# Patient Record
Sex: Female | Born: 1965 | Race: White | Hispanic: No | Smoking: Current every day smoker
Health system: Southern US, Community
[De-identification: ages and names within clinical notes are randomized; demographics above are authoritative.]

## PROBLEM LIST (undated history)

## (undated) DIAGNOSIS — F329 Major depressive disorder, single episode, unspecified: Secondary | ICD-10-CM

## (undated) DIAGNOSIS — E876 Hypokalemia: Secondary | ICD-10-CM

## (undated) DIAGNOSIS — M1990 Unspecified osteoarthritis, unspecified site: Secondary | ICD-10-CM

## (undated) DIAGNOSIS — F1290 Cannabis use, unspecified, uncomplicated: Secondary | ICD-10-CM

## (undated) DIAGNOSIS — N6019 Diffuse cystic mastopathy of unspecified breast: Secondary | ICD-10-CM

## (undated) DIAGNOSIS — F32A Depression, unspecified: Secondary | ICD-10-CM

## (undated) DIAGNOSIS — E039 Hypothyroidism, unspecified: Secondary | ICD-10-CM

## (undated) HISTORY — DX: Depression, unspecified: F32.A

## (undated) HISTORY — DX: Hypothyroidism, unspecified: E03.9

## (undated) HISTORY — DX: Major depressive disorder, single episode, unspecified: F32.9

## (undated) HISTORY — DX: Hypokalemia: E87.6

## (undated) HISTORY — DX: Cannabis use, unspecified, uncomplicated: F12.90

---

## 2005-02-04 ENCOUNTER — Ambulatory Visit: Payer: Self-pay | Admitting: Family Medicine

## 2005-02-24 ENCOUNTER — Ambulatory Visit: Payer: Self-pay | Admitting: Family Medicine

## 2006-03-05 ENCOUNTER — Ambulatory Visit: Payer: Self-pay | Admitting: Certified Nurse Midwife

## 2007-03-18 ENCOUNTER — Ambulatory Visit: Payer: Self-pay | Admitting: Certified Nurse Midwife

## 2008-03-02 ENCOUNTER — Ambulatory Visit: Payer: Self-pay | Admitting: Family Medicine

## 2009-04-03 ENCOUNTER — Ambulatory Visit: Payer: Self-pay | Admitting: Family Medicine

## 2010-12-30 ENCOUNTER — Ambulatory Visit: Payer: Self-pay

## 2013-04-13 ENCOUNTER — Emergency Department: Payer: Self-pay | Admitting: Emergency Medicine

## 2013-04-13 LAB — CBC
HCT: 40.3 % (ref 35.0–47.0)
HGB: 13.6 g/dL (ref 12.0–16.0)
MCHC: 33.8 g/dL (ref 32.0–36.0)
MCV: 96 fL (ref 80–100)
RBC: 4.21 10*6/uL (ref 3.80–5.20)
RDW: 13.2 % (ref 11.5–14.5)
WBC: 3 10*3/uL — ABNORMAL LOW (ref 3.6–11.0)

## 2013-04-13 LAB — URINALYSIS, COMPLETE
Bilirubin,UR: NEGATIVE
Glucose,UR: NEGATIVE mg/dL (ref 0–75)
Ketone: NEGATIVE
Nitrite: NEGATIVE
Ph: 7 (ref 4.5–8.0)
Protein: NEGATIVE

## 2013-04-13 LAB — BASIC METABOLIC PANEL
Anion Gap: 7 (ref 7–16)
Creatinine: 0.68 mg/dL (ref 0.60–1.30)
Osmolality: 267 (ref 275–301)
Potassium: 3.5 mmol/L (ref 3.5–5.1)
Sodium: 134 mmol/L — ABNORMAL LOW (ref 136–145)

## 2016-03-10 ENCOUNTER — Encounter: Payer: Self-pay | Admitting: *Deleted

## 2016-03-10 NOTE — Progress Notes (Unsigned)
Pulmonary Individual Treatment Plan  Patient Details  Name: Kara Russo MRN: [id number] Date of Birth: 08/13/1966 Referring Provider:  No ref. provider found  Initial Encounter Date:   Visit Diagnosis: No diagnosis found.  Patient's Home Medications on Admission: No current outpatient prescriptions on file.  Past Medical History: No past medical history on file.  Tobacco Use: History  Smoking status  . Not on file  Smokeless tobacco  . Not on file    Labs: Recent Review Flowsheet Data    There is no flowsheet data to display.       ADL UCSD:   Pulmonary Function Assessment:   Exercise Target Goals:    Exercise Program Goal: Individual exercise prescription set with THRR, safety & activity barriers. Participant demonstrates ability to understand and report RPE using BORG scale, to self-measure pulse accurately, and to acknowledge the importance of the exercise prescription.  Exercise Prescription Goal: Starting with aerobic activity 30 plus minutes a day, 3 days per week for initial exercise prescription. Provide home exercise prescription and guidelines that participant acknowledges understanding prior to discharge.  Activity Barriers & Risk Stratification:   6 Minute Walk:   Initial Exercise Prescription:   Perform Capillary Blood Glucose checks as needed.  Exercise Prescription Changes:   Exercise Comments:   Discharge Exercise Prescription (Final Exercise Prescription Changes):    Nutrition:  Target Goals: Understanding of nutrition guidelines, daily intake of sodium 1500mg , cholesterol 200mg , calories 30% from fat and 7% or less from saturated fats, daily to have 5 or more servings of fruits and vegetables.  Biometrics:    Nutrition Therapy Plan and Nutrition Goals:   Nutrition Discharge: Rate Your Plate Scores:   Psychosocial: Target Goals: Acknowledge presence or absence of depression, maximize coping skills, provide  positive support system. Participant is able to verbalize types and ability to use techniques and skills needed for reducing stress and depression.  Initial Review & Psychosocial Screening:   Quality of Life Scores:   PHQ-9: Recent Review Flowsheet Data    There is no flowsheet data to display.      Psychosocial Evaluation and Intervention:   Psychosocial Re-Evaluation:  Education: Education Goals: Education classes will be provided on a weekly basis, covering required topics. Participant will state understanding/return demonstration of topics presented.  Learning Barriers/Preferences:   Education Topics: Initial Evaluation Education: - Verbal, written and demonstration of respiratory meds, RPE/PD scales, oximetry and breathing techniques. Instruction on use of nebulizers and MDIs: cleaning and proper use, rinsing mouth with steroid doses and importance of monitoring MDI activations.   General Nutrition Guidelines/Fats and Fiber: -Group instruction provided by verbal, written material, models and posters to present the general guidelines for heart healthy nutrition. Gives an explanation and review of dietary fats and fiber.   Controlling Sodium/Reading Food Labels: -Group verbal and written material supporting the discussion of sodium use in heart healthy nutrition. Review and explanation with models, verbal and written materials for utilization of the food label.   Exercise Physiology & Risk Factors: - Group verbal and written instruction with models to review the exercise physiology of the cardiovascular system and associated critical values. Details cardiovascular disease risk factors and the goals associated with each risk factor.   Aerobic Exercise & Resistance Training: - Gives group verbal and written discussion on the health impact of inactivity. On the components of aerobic and resistive training programs and the benefits of this training and how to safely progress  through these programs.   Flexibility, Balance,  General Exercise Guidelines: - Provides group verbal and written instruction on the benefits of flexibility and balance training programs. Provides general exercise guidelines with specific guidelines to those with heart or lung disease. Demonstration and skill practice provided.   Stress Management: - Provides group verbal and written instruction about the health risks of elevated stress, cause of high stress, and healthy ways to reduce stress.   Depression: - Provides group verbal and written instruction on the correlation between heart/lung disease and depressed mood, treatment options, and the stigmas associated with seeking treatment.   Exercise & Equipment Safety: - Individual verbal instruction and demonstration of equipment use and safety with use of the equipment.   Infection Prevention: - Provides verbal and written material to individual with discussion of infection control including proper hand washing and proper equipment cleaning during exercise session.   Falls Prevention: - Provides verbal and written material to individual with discussion of falls prevention and safety.   Diabetes: - Individual verbal and written instruction to review signs/symptoms of diabetes, desired ranges of glucose level fasting, after meals and with exercise. Advice that pre and post exercise glucose checks will be done for 3 sessions at entry of program.   Chronic Lung Diseases: - Group verbal and written instruction to review new updates, new respiratory medications, new advancements in procedures and treatments. Provide informative websites and "800" numbers of self-education.   Lung Procedures: - Group verbal and written instruction to describe testing methods done to diagnose lung disease. Review the outcome of test results. Describe the treatment choices: Pulmonary Function Tests, ABGs and oximetry.   Energy Conservation: - Provide  group verbal and written instruction for methods to conserve energy, plan and organize activities. Instruct on pacing techniques, use of adaptive equipment and posture/positioning to relieve shortness of breath.   Triggers: - Group verbal and written instruction to review types of environmental controls: home humidity, furnaces, filters, dust mite/pet prevention, HEPA vacuums. To discuss weather changes, air quality and the benefits of nasal washing.   Exacerbations: - Group verbal and written instruction to provide: warning signs, infection symptoms, calling MD promptly, preventive modes, and value of vaccinations. Review: effective airway clearance, coughing and/or vibration techniques. Create an Sports administrator.   Oxygen: - Individual and group verbal and written instruction on oxygen therapy. Includes supplement oxygen, available portable oxygen systems, continuous and intermittent flow rates, oxygen safety, concentrators, and Medicare reimbursement for oxygen.   Respiratory Medications: - Group verbal and written instruction to review medications for lung disease. Drug class, frequency, complications, importance of spacers, rinsing mouth after steroid MDI's, and proper cleaning methods for nebulizers.   AED/CPR: - Group verbal and written instruction with the use of models to demonstrate the basic use of the AED with the basic ABC's of resuscitation.   Breathing Retraining: - Provides individuals verbal and written instruction on purpose, frequency, and proper technique of diaphragmatic breathing and pursed-lipped breathing. Applies individual practice skills.   Anatomy and Physiology of the Lungs: - Group verbal and written instruction with the use of models to provide basic lung anatomy and physiology related to function, structure and complications of lung disease.   Heart Failure: - Group verbal and written instruction on the basics of heart failure: signs/symptoms, treatments,  explanation of ejection fraction, enlarged heart and cardiomyopathy.   Sleep Apnea: - Individual verbal and written instruction to review Obstructive Sleep Apnea. Review of risk factors, methods for diagnosing and types of masks and machines for OSA.   Anxiety: -  Provides group, verbal and written instruction on the correlation between heart/lung disease and anxiety, treatment options, and management of anxiety.   Relaxation: - Provides group, verbal and written instruction about the benefits of relaxation for patients with heart/lung disease. Also provides patients with examples of relaxation techniques.   Knowledge Questionnaire Score:    Personal Goals and Risk Factors at Admission:   Personal Goals and Risk Factors Review:    Personal Goals Discharge (Final Personal Goals and Risk Factors Review):    ITP Comments:   Comments: ***

## 2016-03-26 ENCOUNTER — Encounter: Payer: Self-pay | Admitting: *Deleted

## 2016-03-26 ENCOUNTER — Ambulatory Visit
Admission: RE | Admit: 2016-03-26 | Discharge: 2016-03-26 | Disposition: A | Discharge status: Home / Self Care | Payer: Self-pay | Source: Ambulatory Visit | Attending: Oncology | Admitting: Oncology

## 2016-03-26 ENCOUNTER — Ambulatory Visit: Payer: Self-pay | Attending: Oncology | Admitting: *Deleted

## 2016-03-26 VITALS — BP 100/65 | HR 70 | Temp 98.0°F | Resp 20 | Ht 67.72 in | Wt 154.3 lb

## 2016-03-26 DIAGNOSIS — Z0000 Encounter for general adult medical examination without abnormal findings: Secondary | ICD-10-CM

## 2016-03-26 NOTE — Patient Instructions (Signed)
Gave patient hand-out, Women Staying Healthy, Active and Well from [facility], with education on breast health, pap smears, heart and colon health. 

## 2016-03-26 NOTE — Progress Notes (Signed)
Subjective:     Patient ID: Kara Russo, female   DOB: 10-06-1966, 50 y.o.   MRN: [id number]  HPI   Review of Systems     Objective:   Physical Exam  Pulmonary/Chest: Right breast exhibits no inverted nipple, no mass, no nipple discharge, no skin change and no tenderness. Left breast exhibits no inverted nipple, no mass, no nipple discharge, no skin change and no tenderness. Breasts are symmetrical.  Patient has symmetrical targeted pain at lower outer quadrants of bilateral breast over a bony prominence   Abdominal: There is no splenomegaly or hepatomegaly.  Genitourinary: Rectal exam shows no external hemorrhoid and no internal hemorrhoid. There is breast tenderness. No breast swelling, discharge or bleeding. No labial fusion. There is no rash, tenderness, lesion or injury on the right labia. There is no rash, tenderness, lesion or injury on the left labia. Cervix exhibits friability. Cervix exhibits no motion tenderness and no discharge. Right adnexum displays no mass, no tenderness and no fullness. Left adnexum displays no mass, no tenderness and no fullness.  Friable cervix - bled easily on exam.  Large cystocele noted       Assessment:     50 year old White female returns to [facility] for annual screening.  Complains of bilateral breast pain in the lower outer quadrants over the ribs.  States she drinks caffeine products all day long.  Taught self breast awareness.  Specimen collected for pap smear without difficulty.  Patient has been screened for eligibility.  She does not have any insurance, Medicare or Medicaid.  She also meets financial eligibility.  Hand-out given on the Affordable Care Act.    Plan:     Screening mammogram ordered.  Patient is to decrease caffeine intake and return for repeat clinical breast exam in 2 months.  Specimen sent to the lab.  Will follow-up per [facility] protocol.

## 2016-03-31 LAB — PAP LB AND HPV HIGH-RISK
HPV, high-risk: NEGATIVE
PAP Smear Comment: 0

## 2016-04-01 ENCOUNTER — Encounter: Payer: Self-pay | Admitting: *Deleted

## 2016-04-01 NOTE — Progress Notes (Signed)
Letter mailed to inform patient of her normal mammogram and pap smear.  Next mammo due in one year and pap due in 5 years.  HSIS to Christy. 

## 2016-05-28 ENCOUNTER — Ambulatory Visit: Payer: Self-pay | Attending: Oncology

## 2016-09-10 ENCOUNTER — Encounter: Payer: Self-pay | Admitting: Urology

## 2016-09-10 ENCOUNTER — Ambulatory Visit (INDEPENDENT_AMBULATORY_CARE_PROVIDER_SITE_OTHER): Payer: Self-pay | Admitting: Urology

## 2016-09-10 VITALS — BP 121/78 | HR 80 | Ht 67.0 in | Wt 154.5 lb

## 2016-09-10 DIAGNOSIS — R32 Unspecified urinary incontinence: Secondary | ICD-10-CM

## 2016-09-10 DIAGNOSIS — F329 Major depressive disorder, single episode, unspecified: Secondary | ICD-10-CM | POA: Insufficient documentation

## 2016-09-10 DIAGNOSIS — M0800 Unspecified juvenile rheumatoid arthritis of unspecified site: Secondary | ICD-10-CM | POA: Insufficient documentation

## 2016-09-10 DIAGNOSIS — R3129 Other microscopic hematuria: Secondary | ICD-10-CM

## 2016-09-10 DIAGNOSIS — N393 Stress incontinence (female) (male): Secondary | ICD-10-CM

## 2016-09-10 DIAGNOSIS — N6019 Diffuse cystic mastopathy of unspecified breast: Secondary | ICD-10-CM | POA: Insufficient documentation

## 2016-09-10 DIAGNOSIS — R310 Gross hematuria: Secondary | ICD-10-CM

## 2016-09-10 DIAGNOSIS — F32A Depression, unspecified: Secondary | ICD-10-CM | POA: Insufficient documentation

## 2016-09-10 DIAGNOSIS — E039 Hypothyroidism, unspecified: Secondary | ICD-10-CM | POA: Insufficient documentation

## 2016-09-10 LAB — MICROSCOPIC EXAMINATION: Bacteria, UA: NONE SEEN

## 2016-09-10 LAB — URINALYSIS, COMPLETE
Bilirubin, UA: NEGATIVE
GLUCOSE, UA: NEGATIVE
KETONES UA: NEGATIVE
NITRITE UA: NEGATIVE
Protein, UA: NEGATIVE
Urobilinogen, Ur: 0.2 mg/dL (ref 0.2–1.0)
pH, UA: 6 (ref 5.0–7.5)

## 2016-09-10 LAB — BLADDER SCAN AMB NON-IMAGING: SCAN RESULT: 69

## 2016-09-10 NOTE — Progress Notes (Signed)
09/10/2016 9:40 PM   [name] [birth year] [id number]  Referring provider: Maryland Pink, MD [address] [facility] [place], [place] [address]  Chief Complaint  Patient presents with  . New Patient (Initial Visit)    microscopic hematuria     HPI: Patient is a 50 -year-old Caucasian female who presents today as a referral from their PCP, Dr. Kary Kos, for microscopic hematuria.  Patient was found to have microscopic hematuria on two occasions with 0-3 RBC's/hpf.  Patient doesn't have a prior history of microscopic hematuria.    She has been noticing a dark hue to her urine. She states that at times it has a rusty or yellow green color. She states that the urine stains her toilet bowl.  She does not have a prior history of recurrent urinary tract infections, nephrolithiasis, trauma to the genitourinary tract or malignancies of the genitourinary tract.   She was adopted.    Today, she is having symptoms of frequent urination, incontinence, (which recently started and she is wearing two pads daily) and a weak urinary stream.   She is not having symptoms of urgency, dysuria, nocturia, hesitancy, intermittency or straining to urinate.  Her UA today was unremarkable.    She is not experiencing any suprapubic pain, abdominal pain or flank pain.  She denies any recent fevers, chills, nausea or vomiting.   She has not had any recent imaging studies.   She is a smoker with a 1 ppd history x 35 years.  She drinks mostly sodas.    PMH: Past Medical History:  Diagnosis Date  . Depression   . Hypothyroidism     Surgical History: No past surgical history on file.  Home Medications:    Medication List       Accurate as of 09/10/16 11:59 PM. Always use your most recent med list.          FLUoxetine 20 MG capsule Commonly known as:  PROZAC TAKE 1 CAPSULE (20 MG TOTAL) BY MOUTH ONCE DAILY.   levothyroxine 112 MCG tablet Commonly known as:  SYNTHROID,  LEVOTHROID TAKE 1 TABLET ONCE DAILY ON AN EMPTY STOMACH WITH WATER AT LEAST 30-60 MINUTES BEFORE BREAKFAST       Allergies: No Known Allergies  Family History: Family History  Problem Relation Age of Onset  . Adopted: Yes    Social History:  reports that she has been smoking.  She has a 35.00 pack-year smoking history. She has never used smokeless tobacco. She reports that she does not drink alcohol or use drugs.  ROS: UROLOGY Frequent Urination?: Yes Hard to postpone urination?: No Burning/pain with urination?: No Get up at night to urinate?: No Leakage of urine?: Yes Urine stream starts and stops?: No Trouble starting stream?: No Do you have to strain to urinate?: No Blood in urine?: Yes Urinary tract infection?: No Sexually transmitted disease?: No Injury to kidneys or bladder?: No Painful intercourse?: No Weak stream?: Yes Currently pregnant?: No Vaginal bleeding?: No Last menstrual period?: No  Gastrointestinal Nausea?: No Vomiting?: Yes Indigestion/heartburn?: No Diarrhea?: No Constipation?: Yes  Constitutional Fever: No Night sweats?: Yes Weight loss?: No Fatigue?: Yes  Skin Skin rash/lesions?: No Itching?: Yes  Eyes Blurred vision?: Yes Double vision?: No  Ears/Nose/Throat Sore throat?: Yes Sinus problems?: Yes  Hematologic/Lymphatic Swollen glands?: Yes Easy bruising?: Yes  Cardiovascular Leg swelling?: No Chest pain?: No  Respiratory Cough?: Yes Shortness of breath?: Yes  Endocrine Excessive thirst?: Yes  Musculoskeletal Back pain?: Yes  Joint pain?: No  Neurological Headaches?: Yes Dizziness?: Yes  Psychologic Depression?: Yes Anxiety?: Yes  Physical Exam: BP 121/78   Pulse 80   Ht 5\' 7"  (1.702 m)   Wt 154 lb 8 oz (70.1 kg)   BMI 24.20 kg/m   Constitutional: Well nourished. Alert and oriented, No acute distress. HEENT:  AT, moist mucus membranes. Trachea midline, no masses. Cardiovascular: No clubbing,  cyanosis, or edema. Respiratory: Normal respiratory effort, no increased work of breathing. GI: Abdomen is soft, non tender, non distended, no abdominal masses. Liver and spleen not palpable.  No hernias appreciated.  Stool sample for occult testing is not indicated.   GU: No CVA tenderness.  No bladder fullness or masses.  Normal external genitalia, normal pubic hair distribution, no lesions.  Normal urethral meatus, no lesions, no prolapse, no discharge.   No urethral masses, tenderness and/or tenderness. No bladder fullness, tenderness or masses. Normal vagina mucosa, good estrogen effect, no discharge, no lesions, good pelvic support, Grade II cystocele is noted.  No rectocele is noted.  No cervical motion tenderness.  Uterus is freely mobile and non-fixed.  No adnexal/parametria masses or tenderness noted.  Anus and perineum are without rashes or lesions.    Skin: No rashes, bruises or suspicious lesions. Lymph: No cervical or inguinal adenopathy. Neurologic: Grossly intact, no focal deficits, moving all 4 extremities. Psychiatric: Normal mood and affect.  Laboratory Data: Lab Results  Component Value Date   WBC 3.0 (L) 04/13/2013   HGB 13.6 04/13/2013   HCT 40.3 04/13/2013   MCV 96 04/13/2013   PLT 94 (L) 04/13/2013    Lab Results  Component Value Date   CREATININE 0.76 09/10/2016     Urinalysis Unremarkable.  See EPIC   Assessment & Plan:    1. Gross hematuria  -  I explained to the patient that there are a number of causes that can be associated with blood in the urine, such as stones, UTI's, damage to the urinary tract and/or cancer.  - At this time, I felt that the patient warranted further urologic evaluation.   The AUA guidelines state that a CT urogram is the preferred imaging study to evaluate hematuria.  - I explained to the patient that a contrast material will be injected into a vein and that in rare instances, an allergic reaction can result and may even life  threatening   The patient denies any allergies to contrast, iodine and/or seafood and is not taking metformin.  - Her reproductive status is post menopausal   - Following the imaging study,  I've recommended a cystoscopy. I described how this is performed, typically in an office setting with a flexible cystoscope. We described the risks, benefits, and possible side effects, the most common of which is a minor amount of blood in the urine and/or burning which usually resolves in 24 to 48 hours.    - The patient had the opportunity to ask questions which were answered. Based upon this discussion, the patient is willing to proceed. Therefore, I've ordered: a CT Urogram and cystoscopy.  - She will return following all of the above for discussion of the results.     - Urinalysis, Complete  - CULTURE, URINE COMPREHENSIVE  - BUN+Creat  - Patient would like to postpone her workup until she makes financial arrangements  2. Incontinence  - offered behavioral therapies; bladder training, bladder control strategies, pelvic floor muscle training and fluid management   - offered refer to gynecology for a  pessary fitting   - offered an appointment with one of our surgeon for a possible pelvic sling procedure   - Patient would like to postpone her workup until she makes financial arrangements   Return for patient will call after her appointment with endocronologist.  These notes generated with voice recognition software. I apologize for typographical errors.  Zara Council, [facility] [address] [place], [place] [address] [phone]

## 2016-09-11 LAB — BUN+CREAT
BUN / CREAT RATIO: 16 (ref 9–23)
BUN: 12 mg/dL (ref 6–24)
CREATININE: 0.76 mg/dL (ref 0.57–1.00)
GFR, EST AFRICAN AMERICAN: 105 mL/min/{1.73_m2} (ref >59)
GFR, EST NON AFRICAN AMERICAN: 91 mL/min/{1.73_m2} (ref >59)

## 2016-09-12 LAB — CULTURE, URINE COMPREHENSIVE

## 2016-09-15 ENCOUNTER — Encounter: Payer: Self-pay | Admitting: Urology

## 2016-09-18 ENCOUNTER — Telehealth: Payer: Self-pay | Admitting: Urology

## 2016-09-18 NOTE — Telephone Encounter (Signed)
Patient has schd her CT scan for 09-23-16 but wants to wait until after her scan to talk to you about doing the cysto. She had a follow up appt with you on the 5th to go over the results.  Kara Russo

## 2016-09-23 ENCOUNTER — Ambulatory Visit
Admission: RE | Admit: 2016-09-23 | Discharge: 2016-09-23 | Disposition: A | Discharge status: Home / Self Care | Payer: Self-pay | Source: Ambulatory Visit | Attending: Urology | Admitting: Urology

## 2016-09-23 DIAGNOSIS — R310 Gross hematuria: Secondary | ICD-10-CM | POA: Insufficient documentation

## 2016-09-23 DIAGNOSIS — I700 Atherosclerosis of aorta: Secondary | ICD-10-CM | POA: Insufficient documentation

## 2016-09-23 MED ORDER — IOPAMIDOL (ISOVUE-300) INJECTION 61%
125.0000 mL | Freq: Once | INTRAVENOUS | Status: AC | PRN
  Administered 2016-09-23: 125 mL via INTRAVENOUS

## 2016-09-25 ENCOUNTER — Encounter: Payer: Self-pay | Admitting: Urology

## 2016-09-25 ENCOUNTER — Ambulatory Visit (INDEPENDENT_AMBULATORY_CARE_PROVIDER_SITE_OTHER): Payer: Self-pay | Admitting: Urology

## 2016-09-25 VITALS — BP 100/65 | HR 74 | Ht 67.0 in | Wt 154.4 lb

## 2016-09-25 DIAGNOSIS — R310 Gross hematuria: Secondary | ICD-10-CM

## 2016-09-25 DIAGNOSIS — R32 Unspecified urinary incontinence: Secondary | ICD-10-CM

## 2016-09-25 NOTE — Progress Notes (Signed)
09/25/2016 3:19 PM   Kara Russo [birth year] [id number]  Referring provider: Maryland Pink, MD [address] [facility] [place], [place] [address]  Chief Complaint  Patient presents with  . Hematuria    HPI: Patient is a 50 year old Caucasian female who presents today with a long time friend to discuss the results of her CT urogram that was ordered due to microscopic hematuria.  Background history Patient was a referral from their PCP, Dr. Kary Kos, for microscopic hematuria.  Patient was found to have microscopic hematuria on two occasions with 0-3 RBC's/hpf.  Patient doesn't have a prior history of microscopic hematuria.  She had been noticing a dark hue to her urine. She stated that at times it has a rusty or yellow green color. She stated that the urine stains her toilet bowl.  She does not have a prior history of recurrent urinary tract infections, nephrolithiasis, trauma to the genitourinary tract or malignancies of the genitourinary tract.  She was adopted.  She is a smoker with a 1 ppd history x 35 years.  She drinks mostly sodas.    CT urogram completed on 09/23/2016 noted that the ureters are incompletely opacified on delayed post-contrast images, limiting assessment.  Post contrast delayed images demonstrate no definite filling defects within the collecting system of either kidney, or within the lumen of the urinary bladder to strongly suggest the presence of a urothelial neoplasm. Urinary bladder is normal in appearance. Bilateral adrenal glands are normal in appearance.  No findings to account for the patient's history of gross hematuria. Specifically, no urinary tract calculi no findings of urinary tract obstruction are noted at this time. No acute findings in the abdomen or pelvis.  Aortic atherosclerosis.  I have independently reviewed the films. I have reviewed the films with the patient and her friend.  Today, she is having symptoms of dysuria, nocturia,  incontinence, (which recently started and she is wearing two pads daily) and a weak urinary stream.  Her UA today was unremarkable.  She is not experiencing any suprapubic pain, abdominal pain or flank pain.  She denies any recent fevers, chills, nausea or vomiting.     PMH: Past Medical History:  Diagnosis Date  . Depression   . Hypothyroidism     Surgical History: History reviewed. No pertinent surgical history.  Home Medications:    Medication List       Accurate as of 09/25/16 11:59 PM. Always use your most recent med list.          FLUoxetine 20 MG capsule Commonly known as:  PROZAC TAKE 1 CAPSULE (20 MG TOTAL) BY MOUTH ONCE DAILY.   levothyroxine 112 MCG tablet Commonly known as:  SYNTHROID, LEVOTHROID TAKE 1 TABLET ONCE DAILY ON AN EMPTY STOMACH WITH WATER AT LEAST 30-60 MINUTES BEFORE BREAKFAST       Allergies: No Known Allergies  Family History: Family History  Problem Relation Age of Onset  . Adopted: Yes    Social History:  reports that she has been smoking.  She has a 35.00 pack-year smoking history. She has never used smokeless tobacco. She reports that she does not drink alcohol or use drugs.  ROS: UROLOGY Frequent Urination?: No Hard to postpone urination?: No Burning/pain with urination?: Yes Get up at night to urinate?: Yes Leakage of urine?: Yes Urine stream starts and stops?: No Trouble starting stream?: No Do you have to strain to urinate?: No Blood in urine?: Yes Urinary tract infection?: No Sexually transmitted  disease?: No Injury to kidneys or bladder?: No Painful intercourse?: No Weak stream?: Yes Currently pregnant?: No Vaginal bleeding?: No Last menstrual period?: n  Gastrointestinal Nausea?: No Vomiting?: No Indigestion/heartburn?: No Diarrhea?: No Constipation?: No  Constitutional Fever: No Night sweats?: Yes Weight loss?: No Fatigue?: Yes  Skin Skin rash/lesions?: No Itching?: No  Eyes Blurred vision?:  No Double vision?: No  Ears/Nose/Throat Sore throat?: No Sinus problems?: No  Hematologic/Lymphatic Swollen glands?: No Easy bruising?: Yes  Cardiovascular Leg swelling?: No Chest pain?: No  Respiratory Cough?: No Shortness of breath?: Yes  Endocrine Excessive thirst?: No  Musculoskeletal Back pain?: No Joint pain?: No  Neurological Headaches?: Yes Dizziness?: No  Psychologic Depression?: Yes Anxiety?: Yes  Physical Exam: BP 100/65 (BP Location: Left Arm, Patient Position: Sitting, Cuff Size: Normal)   Pulse 74   Ht 5\' 7"  (1.702 m)   Wt 154 lb 6.4 oz (70 kg)   BMI 24.18 kg/m   Constitutional: Well nourished. Alert and oriented, No acute distress. HEENT: [place] AT, moist mucus membranes. Trachea midline, no masses. Cardiovascular: No clubbing, cyanosis, or edema. Respiratory: Normal respiratory effort, no increased work of breathing. Skin: No rashes, bruises or suspicious lesions. Lymph: No cervical or inguinal adenopathy. Neurologic: Grossly intact, no focal deficits, moving all 4 extremities. Psychiatric: Normal mood and affect.  Laboratory Data: Lab Results  Component Value Date   WBC 3.0 (L) 04/13/2013   HGB 13.6 04/13/2013   HCT 40.3 04/13/2013   MCV 96 04/13/2013   PLT 94 (L) 04/13/2013    Lab Results  Component Value Date   CREATININE 0.76 09/10/2016     Urinalysis Unremarkable.  See EPIC  Assessment & Plan:    1. Gross hematuria  -   I explained to the patient that the lack of opacification of the ureters may not have the detail of excluding some urologic tumors.  Because of this, I offered her to undergo cystoscopy with bilateral retrogrades in the OR to complete the hematuria workup.  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    I described how  this is performed, typically in an office setting with a flexible cystoscope. We described the risks, benefits, and possible side effects, the most common of which is a minor amount of blood in the urine and/or burning which usually resolves in 24 to 48 hours.    - The patient had the opportunity to ask questions which were answered. Based upon this discussion, the patient is willing to proceed. Therefore, I've ordered cystoscopy.     2. Incontinence  - offered behavioral therapies; bladder training, bladder control strategies, pelvic floor muscle training and fluid management   - offered refer to gynecology for a pessary fitting   - offered an appointment with one of our surgeon for a possible pelvic sling procedure   - Patient would like to postpone her workup until she makes financial arrangements  Return for cystoscopy for hematuria.  These notes generated with voice recognition software. I apologize for typographical errors.  Zara Council, [facility] [address] [place], [place] [address] [phone]

## 2016-09-26 LAB — MICROSCOPIC EXAMINATION: BACTERIA UA: NONE SEEN

## 2016-09-26 LAB — URINALYSIS, COMPLETE
BILIRUBIN UA: NEGATIVE
GLUCOSE, UA: NEGATIVE
KETONES UA: NEGATIVE
Nitrite, UA: NEGATIVE
PROTEIN UA: NEGATIVE
SPEC GRAV UA: 1.025 (ref 1.005–1.030)
Urobilinogen, Ur: 0.2 mg/dL (ref 0.2–1.0)
pH, UA: 6.5 (ref 5.0–7.5)

## 2016-09-28 LAB — CULTURE, URINE COMPREHENSIVE

## 2016-10-15 ENCOUNTER — Ambulatory Visit (INDEPENDENT_AMBULATORY_CARE_PROVIDER_SITE_OTHER): Payer: Self-pay | Admitting: Urology

## 2016-10-15 VITALS — BP 102/72 | HR 109 | Ht 67.0 in | Wt 152.0 lb

## 2016-10-15 DIAGNOSIS — R310 Gross hematuria: Secondary | ICD-10-CM

## 2016-10-15 DIAGNOSIS — N8111 Cystocele, midline: Secondary | ICD-10-CM

## 2016-10-15 LAB — URINALYSIS, COMPLETE
Bilirubin, UA: NEGATIVE
Glucose, UA: NEGATIVE
Ketones, UA: NEGATIVE
Leukocytes, UA: NEGATIVE
Nitrite, UA: NEGATIVE
Protein, UA: NEGATIVE
Specific Gravity, UA: 1.025 (ref 1.005–1.030)
Urobilinogen, Ur: 0.2 mg/dL (ref 0.2–1.0)
pH, UA: 5.5 (ref 5.0–7.5)

## 2016-10-15 LAB — MICROSCOPIC EXAMINATION: Bacteria, UA: NONE SEEN

## 2016-10-15 MED ORDER — CIPROFLOXACIN HCL 500 MG PO TABS
500.0000 mg | ORAL_TABLET | Freq: Once | ORAL | Status: AC
  Administered 2016-10-15: 500 mg via ORAL

## 2016-10-15 MED ORDER — LIDOCAINE HCL 2 % EX GEL
1.0000 "application " | Freq: Once | CUTANEOUS | Status: AC
  Administered 2016-10-15: 1 via URETHRAL

## 2016-10-15 NOTE — Progress Notes (Signed)
.     10/15/16  CC:  Chief Complaint  Patient presents with  . Cysto    gross hematuria     HPI: Pt reported gross hematuria at one time but now states she has never seen blood, but dark urine in toilet. She has a history of MH. Here for cystoscopy. Her 09/23/2016 CT was benign - I reviewed the images.   Endorses chronic frequency, nocturia and incontinence. She has a new issue today which is she can feel a bulge or mass per vagina when she coughs or strains. This has been going on for years.   Blood pressure 102/72, pulse (!) 109, height 5\' 7"  (1.702 m), weight 68.9 kg (152 lb). NED. A&Ox3.   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   Cystoscopy Procedure Note  Patient identification was confirmed, informed consent was obtained, and patient was prepped using Betadine solution.  Lidocaine jelly was administered per urethral meatus.    Preoperative abx where received prior to procedure.    Procedure: - Flexible cystoscope introduced, without any difficulty.   - Thorough search of the bladder revealed:    normal urethral meatus    normal urothelium    no stones or foreign bodies     no ulcers     no tumors    no urethral polyps    no trabeculation  - Ureteral orifices were normal in position and appearance. Clear efflux noted.   Post-Procedure: - Patient tolerated the procedure well  Assessment/ Plan:  MH - benign evaluation - see in 1 year   Cystocele - discussed surveillance vs surgical management. Pt not bothered.   Festus Aloe, MD

## 2016-12-15 ENCOUNTER — Emergency Department
Admission: EM | Admit: 2016-12-15 | Discharge: 2016-12-15 | Disposition: A | Discharge status: Home / Self Care | Payer: Self-pay | Attending: Emergency Medicine | Admitting: Emergency Medicine

## 2016-12-15 DIAGNOSIS — F17200 Nicotine dependence, unspecified, uncomplicated: Secondary | ICD-10-CM | POA: Insufficient documentation

## 2016-12-15 DIAGNOSIS — E039 Hypothyroidism, unspecified: Secondary | ICD-10-CM | POA: Insufficient documentation

## 2016-12-15 DIAGNOSIS — K047 Periapical abscess without sinus: Secondary | ICD-10-CM | POA: Insufficient documentation

## 2016-12-15 DIAGNOSIS — M0800 Unspecified juvenile rheumatoid arthritis of unspecified site: Secondary | ICD-10-CM | POA: Insufficient documentation

## 2016-12-15 DIAGNOSIS — F329 Major depressive disorder, single episode, unspecified: Secondary | ICD-10-CM | POA: Insufficient documentation

## 2016-12-15 MED ORDER — CLINDAMYCIN HCL 150 MG PO CAPS: 150.0000 mg | ORAL_CAPSULE | Freq: Four times a day (QID) | ORAL | 0 refills | Status: DC

## 2016-12-15 MED ORDER — CLINDAMYCIN HCL 150 MG PO CAPS
300.0000 mg | ORAL_CAPSULE | Freq: Once | ORAL | Status: AC
  Administered 2016-12-15: 300 mg via ORAL
  Filled 2016-12-15: qty 2

## 2016-12-15 MED ORDER — OXYCODONE-ACETAMINOPHEN 5-325 MG PO TABS: 1.0000 | ORAL_TABLET | Freq: Four times a day (QID) | ORAL | 0 refills | Status: AC | PRN

## 2016-12-15 MED ORDER — IBUPROFEN 600 MG PO TABS: 600.0000 mg | ORAL_TABLET | Freq: Three times a day (TID) | ORAL | 0 refills | Status: DC | PRN

## 2016-12-15 NOTE — Discharge Instructions (Signed)
May follow both list of dental clinics provided. OPTIONS FOR DENTAL FOLLOW UP CARE  [facility] and [place] [email or web address] [phone])  [facility] [phone])  [place] [phone])  [place] [phone])  [facility] [phone]) This clinic caters to the indigent population and is on a lottery system. Location: [facility], [place], [place], [place] Clinic Hours: Wednesdays from 6pm - 9pm, patients seen by a lottery system. For dates, call or go to [email or web address] Services: Cleanings, fillings and simple extractions. Payment Options: DENTAL WORK IS FREE OF CHARGE. Bring proof of income or support. Best way to get seen: Arrive at 5:15 pm - this is a lottery, NOT first come/first serve, so arriving earlier will not increase your chances of being seen.     [facility] [phone] Select option 1 for emergencies   Location: [facility], [place], [address] [place] Clinic Hours: No walk-ins accepted - call the day before to schedule an appointment. Check in times are 9:30 am and 1:30 pm. Services: Simple extractions, temporary fillings, pulpectomy/pulp debridement, uncomplicated abscess drainage. Payment Options: PAYMENT IS DUE AT THE TIME OF SERVICE.  Fee is usually $100-200, additional surgical procedures (e.g. abscess drainage) may be extra. Cash, checks, Visa/MasterCard accepted.  Can file Medicaid if patient is covered for dental - patient should call case worker to check. No discount for [facility] patients. Best way to get seen: MUST call the day before and get onto the schedule. Can usually be seen the next 1-2 days. No walk-ins accepted.      [facility] [phone]   Location: [place], [place] Clinic Hours: M, W, Th, F 8am or 1:30pm, Tues 9a or 1:30 - first come/first served. Services: Simple extractions, temporary fillings, uncomplicated abscess drainage.  You do not need to be an [facility] resident. Payment Options: PAYMENT IS DUE AT THE TIME OF SERVICE. Dental insurance, otherwise sliding scale - bring proof of income or support. Depending on income and treatment needed, cost is usually $50-200. Best way to get seen: Arrive early as it is first come/first served.     [facility] [phone]   Location: [place] [facility] Hours: Mon-Thu 8a-5p Services: Most basic dental services including extractions and fillings. Payment Options: PAYMENT IS DUE AT THE TIME OF SERVICE. Sliding scale, up to 50% off - bring proof if income or support. Medicaid with dental option accepted. Best way to get seen: Call to schedule an appointment, can usually be seen within 2 weeks OR they will try to see walk-ins - show up at [place] or 2p (you may have to wait).     [facility] RESIDENTS ONLY   Location: [facility], [place] [address] [place], [place] [address] Clinic Hours: By appointment only. Monday - Thursday 8am-5pm, Friday 8am-12pm Services: Cleanings, fillings, extractions. Payment Options: PAYMENT IS DUE AT THE TIME OF SERVICE. Cash, Visa or MasterCard. Sliding scale - $30 minimum per service. Best way to get seen: Come in to office, complete packet and make an appointment - need proof of income or support monies for each household member and proof of [facility] residence. Usually takes about a month to get in.     [facility] °[phone] °  °Location: °[address] [place] °Clinic Hours: Walk-in Urgent Care  Dental Services are offered Monday-Friday mornings only. °The numbers of emergencies accepted daily is limited to the number of °providers available. °Maximum 15 - Mondays, Wednesdays & Thursdays °Maximum 10 - Tuesdays & Fridays °Services: °You do not need to be a [place] resident to be seen for a dental emergency. °Emergencies are defined as pain, swelling, abnormal bleeding, or dental trauma. Walkins will receive x-rays if needed. °NOTE: Dental cleaning is not an emergency. °Payment Options: °PAYMENT IS DUE AT THE TIME OF SERVICE. °Minimum co-pay is $40.00 for uninsured patients. °Minimum co-pay is $3.00 for Medicaid with dental coverage. °Dental Insurance is accepted and must be presented at time of visit. °Medicare does not cover dental. °Forms of payment: Cash, credit card, checks. °Best way to get seen: °If not previously registered with the clinic, walk-in dental registration begins at 7:15 am and is on a first come/first serve basis. °If previously registered with the clinic, call to make an appointment. °  °  °[facility] °[phone] °[place] RESIDENTS ONLY °  °Location: °[address], [place], [place] °Clinic Hours: °Mon-Thu 10a-2p °Services: Extractions only! °Payment Options: °FREE (donations accepted) - bring proof of income or support °Best way to get seen: °Call and schedule an appointment OR come at 8am on the 1st Monday of every month (except for holidays) when it is first come/first served. °  °  °[facility] °[phone] °  °Location: °[address], [place] °Clinic Hours: °Friday mornings °Services, Payment Options, Best way to get seen: °Call for info ° °

## 2016-12-15 NOTE — ED Triage Notes (Signed)
Pt reports right side lower jaw pain and swelling that began this morning.

## 2016-12-15 NOTE — ED Provider Notes (Signed)
[facility] Emergency Department Provider Note   ____________________________________________   First MD Initiated Contact with Patient 12/15/16 1806     (approximate)  I have reviewed the triage vital signs and the nursing notes.   HISTORY  Chief Complaint Dental Pain    HPI Kara Russo is a 50 y.o. female patient complain of swelling and dental pain onset this morning.Patient has dental issues for 2 years and has not been evaluated by dentist. Patient rates the pain as a 8/10. Patient is gravida pain is intermittent sharp and achy. Patient state taking BC powder which give her mild transient relief. Patient denies any fever associated this complaint.  Past Medical History:  Diagnosis Date  . Depression   . Hypothyroidism     Patient Active Problem List   Diagnosis Date Noted  . Depression 09/10/2016  . Fibrocystic breast disease 09/10/2016  . Hypothyroidism 09/10/2016  . 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  Prior to Admission medications   Medication Sig Start Date End Date Taking? Authorizing Provider  clindamycin (CLEOCIN) 150 MG capsule Take 1 capsule (150 mg total) by mouth 4 (four) times daily. 12/15/16   Sable Feil, PA-C  FLUoxetine (PROZAC) 20 MG capsule TAKE 1 CAPSULE (20 MG TOTAL) BY MOUTH ONCE DAILY. 06/18/16   Historical Provider, MD  ibuprofen (ADVIL,MOTRIN) 600 MG tablet Take 1 tablet (600 mg total) by mouth every 8 (eight) hours as needed. 12/15/16   Sable Feil, PA-C  levothyroxine (SYNTHROID, LEVOTHROID) 88 MCG tablet Take by mouth. 09/26/16   Historical Provider, MD  oxyCODONE-acetaminophen (ROXICET) 5-325 MG tablet Take 1 tablet by mouth every 6 (six) hours as needed. 12/15/16 12/15/17  Sable Feil, PA-C    Allergies Patient has no known allergies.  Family History  Problem Relation Age of Onset  . Adopted: Yes    Social History Social History  Substance  Use Topics  . Smoking status: Current Every Day Smoker    Packs/day: 1.00    Years: 35.00  . Smokeless tobacco: Never Used  . Alcohol use No    Review of Systems Constitutional: No fever/chills Eyes: No visual changes. ENT: No sore throat.Dental pain Cardiovascular: Denies chest pain. Respiratory: Denies shortness of breath. Gastrointestinal: No abdominal pain.  No nausea, no vomiting.  No diarrhea.  No constipation. Genitourinary: Negative for dysuria. Musculoskeletal: Negative for back pain. Skin: Negative for rash. Neurological: Negative for headaches, focal weakness or numbness. Psychiatric:Depression Endocrine:Hypothyroidism Hematological/Lymphatic: Allergic/Immunilogical: Juvenile rheumatoid arthritis   ____________________________________________   PHYSICAL EXAM:  VITAL SIGNS: ED Triage Vitals  Enc Vitals Group     BP 12/15/16 1756 129/79     Pulse Rate 12/15/16 1756 78     Resp 12/15/16 1756 18     Temp 12/15/16 1756 97.6 F (36.4 C)     Temp Source 12/15/16 1756 Oral     SpO2 12/15/16 1756 96 %     Weight 12/15/16 1756 155 lb (70.3 kg)     Height 12/15/16 1756 5\' 7"  (1.702 m)     Head Circumference --      Peak Flow --      Pain Score 12/15/16 1757 8     Pain Loc --      Pain Edu? --      Excl. in [place]? --     Constitutional: Alert and oriented. Well appearing and in no acute distress. Eyes: Conjunctivae are normal. PERRL. EOMI. Head:  Atraumatic. Nose: No congestion/rhinnorhea. Mouth/Throat: Mucous membranes are moist.  Oropharynx non-erythematous.Devitalized tooth #21 edematous gingiva. Right lateral mandible edema. Neck: No stridor.  No cervical spine tenderness to palpation. Hematological/Lymphatic/Immunilogical: No cervical lymphadenopathy. Cardiovascular: Normal rate, regular rhythm. Grossly normal heart sounds.  Good peripheral circulation. Respiratory: Normal respiratory effort.  No retractions. Lungs CTAB. Gastrointestinal: Soft and  nontender. No distention. No abdominal bruits. No CVA tenderness. Musculoskeletal: No lower extremity tenderness nor edema.  No joint effusions. Neurologic:  Normal speech and language. No gross focal neurologic deficits are appreciated. No gait instability. Skin:  Skin is warm, dry and intact. No rash noted. Psychiatric: Mood and affect are normal. Speech and behavior are normal.  ____________________________________________   LABS (all labs ordered are listed, but only abnormal results are displayed)  Labs Reviewed - No data to display ____________________________________________  EKG   ____________________________________________  RADIOLOGY   ____________________________________________   PROCEDURES  Procedure(s) performed: None  Procedures  Critical Care performed: No  ____________________________________________   INITIAL IMPRESSION / ASSESSMENT AND PLAN / ED COURSE  Pertinent labs & imaging results that were available during my care of the patient were reviewed by me and considered in my medical decision making (see chart for details).  Dental pain secondary to abscess. Patient given discharge care instructions. Patient given prescription for clindamycin and Percocets. Patient advised to follow-up for list of dental clinics provided. Return by ER for condition worsens.  Clinical Course      ____________________________________________   FINAL CLINICAL IMPRESSION(S) / ED DIAGNOSES  Final diagnoses:  Dental abscess      NEW MEDICATIONS STARTED DURING THIS VISIT:  Discharge Medication List as of 12/15/2016  6:16 PM    START taking these medications   Details  clindamycin (CLEOCIN) 150 MG capsule Take 1 capsule (150 mg total) by mouth 4 (four) times daily., Starting Mon 12/15/2016, Print    ibuprofen (ADVIL,MOTRIN) 600 MG tablet Take 1 tablet (600 mg total) by mouth every 8 (eight) hours as needed., Starting Mon 12/15/2016, Print      oxyCODONE-acetaminophen (ROXICET) 5-325 MG tablet Take 1 tablet by mouth every 6 (six) hours as needed., Starting Mon 12/15/2016, Until Tue 12/15/2017, Print         Note:  This document was prepared using Dragon voice recognition software and may include unintentional dictation errors.    Sable Feil, PA-C 12/15/16 [place], MD 12/15/16 [phone]

## 2017-09-01 ENCOUNTER — Telehealth (HOSPITAL_COMMUNITY): Payer: Self-pay

## 2017-09-01 NOTE — Telephone Encounter (Signed)
Pt.  Is scheduled for [facility] on 9/14 was calling to see if pt wants to reschedule due to weather.

## 2017-10-13 NOTE — Progress Notes (Signed)
10/15/2017 9:04 AM   Kara Russo 1966-07-31 [id number]  Referring provider: Maryland Pink, MD [address] [facility] [place], [place] [address]  Chief Complaint  Patient presents with  . Hematuria    22yr f/u    HPI: Patient is a 51 year old Caucasian female with a history of hematuria and urinary incontinence who presents today for yearly follow-up.  History of hematuria She was adopted.  She is a smoker with a 1 ppd history x 35 years.  She drinks mostly sodas.  CT urogram completed on 09/23/2016 noted that the ureters are incompletely opacified on delayed post-contrast images, limiting assessment.  Post contrast delayed images demonstrate no definite filling defects within the collecting system of either kidney, or within the lumen of the urinary bladder to strongly suggest the presence of a urothelial neoplasm. Urinary bladder is normal in appearance. Bilateral adrenal glands are normal in appearance.  No findings to account for the patient's history of gross hematuria. Specifically, no urinary tract calculi no findings of urinary tract obstruction are noted at this time. No acute findings in the abdomen or pelvis.  Aortic atherosclerosis.  Cystoscopy performed on 10/15/2016 with Dr. Junious Silk was negative.  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  Her UA today is negative.  She is not had episodes of gross hematuria.  Incontinence Today she is complaining of frequency and nocturia.   She is wearing two pads daily.  She is experiencing urgency x 0-3, frequency x 8 or more, not restricting fluids to avoid visits to the restroom, is engaging in toilet mapping, incontinence x 0-3 and nocturia x 0-3.   Her PVR is 53 mL.   she denies dysuria and suprapubic pain. She's not had fevers, chills, nausea or vomiting.     PMH: Past Medical History:  Diagnosis Date  . Depression   . Hypothyroidism     Surgical History: No past surgical  history on file.  Home Medications:  Allergies as of 10/15/2017   No Known Allergies     Medication List       Accurate as of 10/15/17  9:04 AM. Always use your most recent med list.          clindamycin 150 MG capsule Commonly known as:  CLEOCIN Take 1 capsule (150 mg total) by mouth 4 (four) times daily.   FLUoxetine 20 MG capsule Commonly known as:  PROZAC TAKE 1 CAPSULE (20 MG TOTAL) BY MOUTH ONCE DAILY.   ibuprofen 600 MG tablet Commonly known as:  ADVIL,MOTRIN Take 1 tablet (600 mg total) by mouth every 8 (eight) hours as needed.   levothyroxine 88 MCG tablet Commonly known as:  SYNTHROID, LEVOTHROID Take by mouth.   oxyCODONE-acetaminophen 5-325 MG tablet Commonly known as:  ROXICET Take 1 tablet by mouth every 6 (six) hours as needed.       Allergies: No Known Allergies  Family History: Family History  Problem Relation Age of Onset  . Adopted: Yes    Social History:  reports that she has been smoking.  She has a 35.00 pack-year smoking history. She has never used smokeless tobacco. She reports that she does not drink alcohol or use drugs.  ROS: UROLOGY Frequent Urination?: Yes Hard to postpone urination?: No Burning/pain with urination?: No Get up at night to urinate?: Yes Leakage of urine?: No Urine stream starts and stops?: No Trouble starting stream?: No Do you have to strain to urinate?: No Blood in  urine?: No Urinary tract infection?: No Sexually transmitted disease?: No Injury to kidneys or bladder?: No Painful intercourse?: No Weak stream?: No Currently pregnant?: No Vaginal bleeding?: No Last menstrual period?: n  Gastrointestinal Nausea?: No Vomiting?: No Indigestion/heartburn?: No Diarrhea?: No Constipation?: No  Constitutional Fever: No Night sweats?: No Weight loss?: No Fatigue?: Yes  Skin Skin rash/lesions?: No Itching?: No  Eyes Blurred vision?: No Double vision?: No  Ears/Nose/Throat Sore throat?:  No Sinus problems?: No  Hematologic/Lymphatic Swollen glands?: No Easy bruising?: No  Cardiovascular Leg swelling?: No Chest pain?: No  Respiratory Cough?: No Shortness of breath?: No  Endocrine Excessive thirst?: No  Musculoskeletal Back pain?: Yes Joint pain?: No  Neurological Headaches?: No Dizziness?: No  Psychologic Depression?: Yes Anxiety?: Yes  Physical Exam: BP 105/72   Pulse 83   Ht 5\' 7"  (1.702 m)   Wt 165 lb 8 oz (75.1 kg)   BMI 25.92 kg/m   Constitutional: Well nourished. Alert and oriented, No acute distress. HEENT: [place] AT, moist mucus membranes. Trachea midline, no masses. Cardiovascular: No clubbing, cyanosis, or edema. Respiratory: Normal respiratory effort, no increased work of breathing. Skin: No rashes, bruises or suspicious lesions. Lymph: No cervical or inguinal adenopathy. Neurologic: Grossly intact, no focal deficits, moving all 4 extremities. Psychiatric: Normal mood and affect.  Laboratory Data: Urinalysis Negative.  See EPIC  Assessment & Plan:    1. History of hematuria  -  Completed hematuria work up with CTU and cystoscopy in 2017 - no worrisome GU malignancies found  - UA today negative  - Patient will report any gross hematuria  - RTC in one year for UA    2. Incontinence  - offered behavioral therapies; bladder training, bladder control strategies, pelvic floor muscle training and fluid management   - offered refer to gynecology for a pessary fitting   - offered an appointment with one of our surgeon for a possible pelvic sling procedure   - Patient would like to postpone her workup until she makes financial arrangement   - Given handout on Kegel exercises and dietary bladder irritants  3. NSAID abuse  - Advised the patient of the dangers of NSAIDs such as kidney failure and gastric bleeds  - She has recently started therapy at [facility] for her stress and hopefully this will help decrease the amount of BC powders she is  using daily  - Encouraged her to see her primary care physician as she states she takes the [facility] powders for headaches as well to see if there is other treatment options for her chronic headaches  Return in about 1 year (around 10/15/2018) for UA, PVR and OAB questionnaire.  These notes generated with voice recognition software. I apologize for typographical errors.  Zara Council, [facility] [address], [place], [place] [address] [phone]

## 2017-10-15 ENCOUNTER — Encounter: Payer: Self-pay | Admitting: Urology

## 2017-10-15 ENCOUNTER — Ambulatory Visit (INDEPENDENT_AMBULATORY_CARE_PROVIDER_SITE_OTHER): Payer: Self-pay | Admitting: Urology

## 2017-10-15 VITALS — BP 105/72 | HR 83 | Ht 67.0 in | Wt 165.5 lb

## 2017-10-15 DIAGNOSIS — R32 Unspecified urinary incontinence: Secondary | ICD-10-CM

## 2017-10-15 DIAGNOSIS — Z791 Long term (current) use of non-steroidal anti-inflammatories (NSAID): Secondary | ICD-10-CM

## 2017-10-15 DIAGNOSIS — Z87448 Personal history of other diseases of urinary system: Secondary | ICD-10-CM

## 2017-10-15 DIAGNOSIS — R310 Gross hematuria: Secondary | ICD-10-CM

## 2017-10-15 LAB — URINALYSIS, COMPLETE
Bilirubin, UA: NEGATIVE
Glucose, UA: NEGATIVE
Ketones, UA: NEGATIVE
LEUKOCYTES UA: NEGATIVE
NITRITE UA: NEGATIVE
PH UA: 5.5 (ref 5.0–7.5)
PROTEIN UA: NEGATIVE
Specific Gravity, UA: 1.01 (ref 1.005–1.030)
Urobilinogen, Ur: 0.2 mg/dL (ref 0.2–1.0)

## 2017-10-15 LAB — BLADDER SCAN AMB NON-IMAGING: SCAN RESULT: 53

## 2017-10-28 ENCOUNTER — Emergency Department: Payer: Self-pay

## 2017-10-28 ENCOUNTER — Encounter: Payer: Self-pay | Admitting: Emergency Medicine

## 2017-10-28 DIAGNOSIS — M25512 Pain in left shoulder: Secondary | ICD-10-CM | POA: Insufficient documentation

## 2017-10-28 DIAGNOSIS — Z5321 Procedure and treatment not carried out due to patient leaving prior to being seen by health care provider: Secondary | ICD-10-CM | POA: Insufficient documentation

## 2017-10-28 DIAGNOSIS — R002 Palpitations: Secondary | ICD-10-CM | POA: Insufficient documentation

## 2017-10-28 LAB — CBC
HCT: 42.5 % (ref 35.0–47.0)
Hemoglobin: 14.4 g/dL (ref 12.0–16.0)
MCH: 33.4 pg (ref 26.0–34.0)
MCHC: 34 g/dL (ref 32.0–36.0)
MCV: 98.2 fL (ref 80.0–100.0)
PLATELETS: 180 10*3/uL (ref 150–440)
RBC: 4.32 MIL/uL (ref 3.80–5.20)
RDW: 13.1 % (ref 11.5–14.5)
WBC: 9.7 10*3/uL (ref 3.6–11.0)

## 2017-10-28 NOTE — ED Triage Notes (Signed)
Pt c/o intermittent feeling of heart palpitations x1 month, worsening today with accompanying left shoulder pain. Pt denies SOB, N/V. Pt reports pt has not had the palpations since arriving to ED. Pt took Asprin and Tums before arriving to ED.

## 2017-10-29 ENCOUNTER — Emergency Department
Admission: EM | Admit: 2017-10-29 | Discharge: 2017-10-29 | Disposition: A | Discharge status: Home / Self Care | Payer: Self-pay | Attending: Emergency Medicine | Admitting: Emergency Medicine

## 2017-10-29 LAB — TROPONIN I: Troponin I: <0.03 ng/mL (ref <0.03)

## 2017-10-29 LAB — BASIC METABOLIC PANEL
Anion gap: 11 (ref 5–15)
BUN: 9 mg/dL (ref 6–20)
CHLORIDE: 101 mmol/L (ref 101–111)
CO2: 26 mmol/L (ref 22–32)
CREATININE: 0.69 mg/dL (ref 0.44–1.00)
Calcium: 9.6 mg/dL (ref 8.9–10.3)
GFR calc non Af Amer: >60 mL/min (ref >60)
GLUCOSE: 109 mg/dL — AB (ref 65–99)
Potassium: 4 mmol/L (ref 3.5–5.1)
Sodium: 138 mmol/L (ref 135–145)

## 2018-10-13 ENCOUNTER — Ambulatory Visit: Payer: Self-pay | Admitting: Urology

## 2020-08-07 ENCOUNTER — Other Ambulatory Visit: Payer: Self-pay | Admitting: Infectious Diseases

## 2020-08-07 DIAGNOSIS — Z1231 Encounter for screening mammogram for malignant neoplasm of breast: Secondary | ICD-10-CM

## 2020-09-26 LAB — COLOGUARD: COLOGUARD: NEGATIVE
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
## 2020-12-04 ENCOUNTER — Encounter: Payer: Self-pay | Admitting: Psychiatry

## 2020-12-04 ENCOUNTER — Telehealth (INDEPENDENT_AMBULATORY_CARE_PROVIDER_SITE_OTHER): Payer: 59 | Admitting: Psychiatry

## 2020-12-04 ENCOUNTER — Other Ambulatory Visit: Payer: Self-pay

## 2020-12-04 DIAGNOSIS — F330 Major depressive disorder, recurrent, mild: Secondary | ICD-10-CM

## 2020-12-04 NOTE — Patient Instructions (Signed)
1. Continue fluoxetine 60 mg daily  2. Continue Abilify 2 mg daily  4. Referral to therapy  5. Referral to neuropsychological testing for ADHD 6. Next appointment- 1/11 at 4 PM

## 2020-12-18 ENCOUNTER — Encounter: Payer: Self-pay | Admitting: Psychology

## 2020-12-25 ENCOUNTER — Encounter: Payer: Self-pay | Admitting: Licensed Clinical Social Worker

## 2020-12-25 ENCOUNTER — Other Ambulatory Visit: Payer: Self-pay

## 2020-12-25 ENCOUNTER — Ambulatory Visit (INDEPENDENT_AMBULATORY_CARE_PROVIDER_SITE_OTHER): Payer: 59 | Admitting: Licensed Clinical Social Worker

## 2020-12-25 DIAGNOSIS — F330 Major depressive disorder, recurrent, mild: Secondary | ICD-10-CM
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
## 2021-01-01 ENCOUNTER — Telehealth: Payer: 59 | Admitting: Psychiatry

## 2021-01-01 ENCOUNTER — Telehealth: Payer: Self-pay | Admitting: Psychiatry

## 2021-01-01 ENCOUNTER — Other Ambulatory Visit: Payer: Self-pay

## 2021-01-01 NOTE — Telephone Encounter (Signed)
Sent link for video visit through Epic. Patient did not sign in. Called the patient  for appointment scheduled today. The patient did not answer the phone. Left voice message to contact the office.  

## 2021-01-02 ENCOUNTER — Encounter: Payer: Self-pay | Admitting: Psychiatry

## 2021-01-02 ENCOUNTER — Telehealth (INDEPENDENT_AMBULATORY_CARE_PROVIDER_SITE_OTHER): Payer: 59 | Admitting: Psychiatry

## 2021-01-02 ENCOUNTER — Other Ambulatory Visit: Payer: Self-pay

## 2021-01-02 DIAGNOSIS — F330 Major depressive disorder, recurrent, mild: Secondary | ICD-10-CM

## 2021-01-02 DIAGNOSIS — F411 Generalized anxiety disorder: Secondary | ICD-10-CM

## 2021-01-02 DIAGNOSIS — G4700 Insomnia, unspecified: Secondary | ICD-10-CM | POA: Diagnosis not present

## 2021-01-02 MED ORDER — FLUOXETINE HCL 40 MG PO CAPS: 80.0000 mg | ORAL_CAPSULE | Freq: Every day | ORAL | 1 refills | Status: DC
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
## 2021-01-09 ENCOUNTER — Ambulatory Visit: Payer: Self-pay | Admitting: Psychology

## 2021-01-22 ENCOUNTER — Encounter: Payer: 59 | Attending: Psychology | Admitting: Psychology

## 2021-01-22 ENCOUNTER — Encounter: Payer: Self-pay | Admitting: Licensed Clinical Social Worker

## 2021-01-22 ENCOUNTER — Other Ambulatory Visit: Payer: Self-pay

## 2021-01-22 ENCOUNTER — Ambulatory Visit (INDEPENDENT_AMBULATORY_CARE_PROVIDER_SITE_OTHER): Payer: 59 | Admitting: Licensed Clinical Social Worker

## 2021-01-22 ENCOUNTER — Encounter: Payer: Self-pay | Admitting: Psychology

## 2021-01-22 DIAGNOSIS — F330 Major depressive disorder, recurrent, mild: Secondary | ICD-10-CM | POA: Diagnosis not present

## 2021-01-22 DIAGNOSIS — G4700 Insomnia, unspecified: Secondary | ICD-10-CM

## 2021-01-22 DIAGNOSIS — R419 Unspecified symptoms and signs involving cognitive functions and awareness: Secondary | ICD-10-CM | POA: Insufficient documentation

## 2021-01-22 NOTE — Progress Notes (Signed)
Virtual Visit via Video Note  I connected with Kara Russo on 01/22/21 at  9:00 AM EST by a video enabled telemedicine application and verified that I am speaking with the correct person using two identifiers.  Participating Parties Patient Provider  Location: Patient: Home Provider: Home Office   I discussed the limitations of evaluation and management by telemedicine and the availability of in person appointments. The patient expressed understanding and agreed to proceed.  THERAPY PROGRESS NOTE  Session Time: 30 Minutes  Participation Level: Active  Behavioral Response: Well GroomedAlertAnxious  Type of Therapy: Individual Therapy  Treatment Goals addressed: Coping  Describe the signs and symptoms of depression that are experienced - ongoing Utilize behavioral strategies to overcome depression - ongoing Learn and implement skills to reduce overall tension and moments of increased anxiety - ongoing  Interventions: CBT  Summary: Kara Russo is a 55 y.o. female who presents with depression sxs. Pt reported "I am doing okay.so-so". Pt reported she recently had medication dosages increased and is adjusting. Pt reported "it leveled out. First it was difficult". Pt reported experiencing "interrupted sleep" due to "leg spasms" which are "pretty consistent". Pt reported this often leads to fatigue. Pt reported work is "a little stressful due to short staffed and longer hours. I have a neurologist appointment this afternoon" to test for possible ADHD dx. "I am having difficulty concentrating while at work". Pt reported she has not been leaving house much except for work and then "and going straight home". Pt reported she follows a routine and is usually able to get to sleep by following it. Pt reported "when going through a bad bout of depression I will just lay there and think". Pt engaged in mindfulness activity. Pt reported she would like to follow up next month after attending  her granddaughter's birthday party as "I am going to be facing a fear of mine". Pt reported this will be the first time going to a family event in which her ex-spouse will be there and is experiencing some anticipatory anxiety about this. Pt reported she will use what she has learned today to cope.   Suicidal/Homicidal: No  Therapist Response: Therapist met with patient for follow up after completing CCA. Therapist and patient reviewed treatment plan and goals. Pt in agreement. Therapist and patient explored stressors and attempts to cope. Therapist provided psychoeducation around sleep hygiene and mindfulness. Therapist engaged patient in the self-soothing with 5 senses technique. Pt was receptive.  Plan: Return again in 1 month.  Diagnosis: Axis I: MDD, Recurrent, Mild and Insomnia    Axis II: N/A  Josephine Igo, LCSW, LCAS 01/22/2021

## 2021-01-22 NOTE — Progress Notes (Signed)
NEUROBEHAVIORAL STATUS EXAM   Name: Kara Russo Date of Birth: 11/26/1966 Date of Interview: 01/22/2021  Reason for Referral:  Kara Russo is a 55 y.o. female who is referred for neuropsychological evaluation by Dr. Modesta Messing (Psychiatry) due to trouble concentrating and concerns about possible ADHD. This patient is unaccompanied in the office.  History of Presenting Problem:     Patient reported primary difficulties with paying attention, racing thoughts, and competing tasks.    Onset/Course: Before 55 y.o./Chronic w/ gradual worsening    Patient described longstanding problems paying attention and staying focused in multiple areas of her life. She recalled having trouble with reading from a young age along with difficulties with comprehension and writing. 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 She typically performed better in math. However, she was forced to repeat a pre-algebra course in 9th grade after receiving a failing grade.   Upon direct questioning, the patient reported:  Forgetting recent conversations/events: Mild trouble Repeating statements/questions: Denied Misplacing/losing items: Denied  Forgetting appointments or other obligations: Denied  Forgetting to take medications: Denied   Difficulty concentrating: Longstanding  Starting but not finishing tasks: Denied  Distracted easily: Longstanding  Processing information more slowly: Longstanding, mild difficulties   Family neuro hx: Unknown due to adoption  Any family hx dementia? Unknown due to adoption   Current Functioning: Work: Waitress    Reside: With mother (47 y.o.).   Complex ADLs Driving: Able to perform independently w/o difficulty Medication management: Able to perform independently w/o difficulty Management of finances: Able to perform independently w/o difficulty Appointments: Uses calender for assistance  Cooking: Able to perform  independently w/o difficulty  Medical/Physical complaints:  Any hx of stroke/TIA, MI, LOC/TBI, Sz? Denied  Hx falls? Denied  Balance, probs walking? Denied  Sleep: Insomnia? OSA? CPAP? REM sleep beh sx? Lifelong trouble falling asleep. Has "kicked legs in bed" as long as can remember to help fall asleep, which takes around 1 hour. More recently, patient reports need to "shake leg" to fall asleep. Sleeps around 8 hours on average per night.    Visual illusions/hallucinations? Denied  Appetite/Nutrition/Weight changes: No; Appetite is described as "good"    Current mood: Depressed and anxious   Behavioral disturbance/Personality change: Denied    Suicidal Ideation/Intention: Denied    Psychiatric History:  History of depression, anxiety, other MH disorder: Onset of depression around 7th grade (e.g., "did not make friends easily").   History of MH treatment: Zoloft for the last 4-5 years; good effect reported. Prescribed Concerta from [id number] by a historical provider   History of SI: Denied  History of substance dependence/treatment: Cocaine abuse in early 1990's. No formal treatment. Has not used since 1st child was born (circa 1995)  Social History: Born/Raised: [place], [place]. Adopted at 3 months. Raised with 1 older non-biological brother; strained relationship. Biological mother reportedly had psychiatric history remarkable for bipolar disorder. Discovered 3 half-brothers via [email or web address]; 2 committed suicide.   Education: 14; [employer] Occupational history: Accountant for several years. Experienced difficulty remaining still for even a short period of time and trouble persisting on job related tasks for more than 30 min; had informal discussions with boss about these issues but never was formally reprimanded or written up.  Marital history: Divorced same man twice. Currently single.     Children: 1 daughter (67 y.o.) and 1 son (9 y.o.)  Alcohol: Denied  Tobacco: 1  pack daily  SA: THC on  occasion   Medical History: Past Medical History:  Diagnosis Date  . Depression   . Hypothyroidism    Current Medications:  Outpatient Encounter Medications as of 01/22/2021  Medication Sig  . ARIPiprazole (ABILIFY) 2 MG tablet Take 2 mg by mouth daily.  . clindamycin (CLEOCIN) 150 MG capsule Take 1 capsule (150 mg total) by mouth 4 (four) times daily. (Patient not taking: Reported on 10/15/2017)  . FLUoxetine (PROZAC) 40 MG capsule Take 2 capsules (80 mg total) by mouth daily.  Marland Kitchen ibuprofen (ADVIL,MOTRIN) 600 MG tablet Take 1 tablet (600 mg total) by mouth every 8 (eight) hours as needed. (Patient not taking: Reported on 10/15/2017)  . levothyroxine (SYNTHROID) 100 MCG tablet Take 100 mcg by mouth daily before breakfast.   No facility-administered encounter medications on file as of 01/22/2021.   Behavioral Observations:   Appearance: Unkempt and older than stated age. Gait: Ambulated independently, no gross abnormalities observed. Speech: Fluent; normal rate, rhythm and volume. No word finding difficulty. Thought process: Goal directed, tangential and ruminative  Affect: Full, anxious Interpersonal: Guarded and slow to warm up, appropriate   60 minutes spent face-to-face with patient completing neurobehavioral status exam. 60  minutes spent integrating medical records/clinical data and completing this report. [id number] unit; [id number].  TESTING: There is medical necessity to proceed with neuropsychological assessment as the results will be used to aid in differential diagnosis and clinical decision-making and to inform specific treatment recommendations. Per the patient, and medical records reviewed, there has been a longstanding history of reported impairments due to inattention, problems with concentration, and hyperactivity, and reasonable suspicion of Adult residual ADHD.   Clinical Decision Making: In considering the patient's current level of functioning,  level of presumed impairment, nature of symptoms, emotional and behavioral responses during the interview, level of literacy, and observed level of motivation, a battery of tests was selected. These include a Comprehensive Attention Battery (CAB), Continuous Performance Test (CPT),  [place] Multiphasic Personality Inventory-2nd edition (MMPI-II), and Wechsler Adult Intelligence Scale, 4th Edition (WAIS-IV). Patient will also complete the following self-reported screening questionnaires: BDI-II, GAD-7, and Box for the Attention Deficit Hyperactivity Disorder.     PLAN: The patient will return to complete the above referenced full battery of neuropsychological testing with this provider during a separate testing appointment. Education regarding testing procedures was provided to the patient. Subsequently, the patient will see this provider for a follow-up session at which time her test performances and my impressions and treatment recommendations will be reviewed in detail.   Evaluation ongoing; full report to follow.
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
## 2021-02-06 ENCOUNTER — Telehealth (INDEPENDENT_AMBULATORY_CARE_PROVIDER_SITE_OTHER): Payer: 59 | Admitting: Psychiatry

## 2021-02-06 ENCOUNTER — Encounter: Payer: Self-pay | Admitting: Psychiatry

## 2021-02-06 ENCOUNTER — Other Ambulatory Visit: Payer: Self-pay

## 2021-02-06 DIAGNOSIS — F330 Major depressive disorder, recurrent, mild: Secondary | ICD-10-CM

## 2021-02-06 DIAGNOSIS — F411 Generalized anxiety disorder: Secondary | ICD-10-CM | POA: Diagnosis not present

## 2021-02-06 MED ORDER — BUPROPION HCL ER (XL) 150 MG PO TB24: 150.0000 mg | ORAL_TABLET | Freq: Every day | ORAL | 1 refills | Status: DC

## 2021-02-06 MED ORDER — FLUOXETINE HCL 40 MG PO CAPS: 80.0000 mg | ORAL_CAPSULE | Freq: Every day | ORAL | 0 refills | Status: DC

## 2021-02-18 ENCOUNTER — Telehealth: Payer: Self-pay | Admitting: *Deleted

## 2021-02-18 DIAGNOSIS — Z122 Encounter for screening for malignant neoplasm of respiratory organs: Secondary | ICD-10-CM

## 2021-02-18 DIAGNOSIS — F17200 Nicotine dependence, unspecified, uncomplicated: Secondary | ICD-10-CM

## 2021-02-18 DIAGNOSIS — Z87891 Personal history of nicotine dependence: Secondary | ICD-10-CM

## 2021-02-18 NOTE — Telephone Encounter (Signed)
Received referral for low dose lung cancer screening CT scan. Message left at phone number listed in EMR for patient to call me back to facilitate scheduling scan.  

## 2021-02-20 NOTE — Telephone Encounter (Signed)
Received referral for initial lung cancer screening scan. Contacted patient and obtained smoking history,(current, 39 pack year) as well as answering questions related to screening process. Patient denies signs of lung cancer such as weight loss or hemoptysis. Patient denies comorbidity that would prevent curative treatment if lung cancer were found. Patient is scheduled for shared decision making visit and CT scan on 03/06/21 at 1045am.

## 2021-02-20 NOTE — Addendum Note (Signed)
Addended by: Lieutenant Diego on: 02/20/2021 08:28 AM   Modules accepted: Orders

## 2021-03-06 ENCOUNTER — Ambulatory Visit
Admission: RE | Admit: 2021-03-06 | Discharge: 2021-03-06 | Disposition: A | Discharge status: Home / Self Care | Payer: 59 | Source: Ambulatory Visit | Attending: Oncology | Admitting: Oncology

## 2021-03-06 ENCOUNTER — Other Ambulatory Visit: Payer: Self-pay

## 2021-03-06 ENCOUNTER — Encounter: Payer: Self-pay | Admitting: Oncology

## 2021-03-06 ENCOUNTER — Inpatient Hospital Stay: Payer: 59 | Attending: Oncology | Admitting: Oncology

## 2021-03-06 DIAGNOSIS — Z122 Encounter for screening for malignant neoplasm of respiratory organs: Secondary | ICD-10-CM | POA: Insufficient documentation

## 2021-03-06 DIAGNOSIS — F17200 Nicotine dependence, unspecified, uncomplicated: Secondary | ICD-10-CM | POA: Insufficient documentation

## 2021-03-06 DIAGNOSIS — F17210 Nicotine dependence, cigarettes, uncomplicated: Secondary | ICD-10-CM | POA: Diagnosis not present

## 2021-03-06 DIAGNOSIS — Z87891 Personal history of nicotine dependence: Secondary | ICD-10-CM

## 2021-03-06 NOTE — Progress Notes (Signed)
Virtual Visit via Video Note  I connected with [name] on 03/06/21 at 10:45 AM EDT by a video enabled telemedicine application and verified that I am speaking with the correct person using two identifiers.  Location: Patient: [facility] Provider: Clinic    I discussed the limitations of evaluation and management by telemedicine and the availability of in person appointments. The patient expressed understanding and agreed to proceed.  I discussed the assessment and treatment plan with the patient. The patient was provided an opportunity to ask questions and all were answered. The patient agreed with the plan and demonstrated an understanding of the instructions.   The patient was advised to call back or seek an in-person evaluation if the symptoms worsen or if the condition fails to improve as anticipated.   In accordance with CMS guidelines, patient has met eligibility criteria including age, absence of signs or symptoms of lung cancer.  Social History   Tobacco Use  . Smoking status: Current Every Day Smoker    Packs/day: 1.00    Years: 39.00    Pack years: 39.00  . Smokeless tobacco: Never Used  Substance Use Topics  . Alcohol use: No  . Drug use: No      A shared decision-making session was conducted prior to the performance of CT scan. This includes one or more decision aids, includes benefits and harms of screening, follow-up diagnostic testing, over-diagnosis, false positive rate, and total radiation exposure.   Counseling on the importance of adherence to annual lung cancer LDCT screening, impact of co-morbidities, and ability or willingness to undergo diagnosis and treatment is imperative for compliance of the program.   Counseling on the importance of continued smoking cessation for former smokers; the importance of smoking cessation for current smokers, and information about tobacco cessation interventions have been given to patient including [place] and 1800  quit [place] programs.   Written order for lung cancer screening with LDCT has been given to the patient and any and all questions have been answered to the best of my abilities.    Yearly follow up will be coordinated by Burgess Estelle, Thoracic Navigator.  I provided 15 minutes of face-to-face video visit time during this encounter, and > 50% was spent counseling as documented under my assessment & plan.   Jacquelin Hawking, NP

## 2021-03-11 ENCOUNTER — Encounter: Payer: 59 | Attending: Psychology | Admitting: Psychology

## 2021-03-11 ENCOUNTER — Encounter: Payer: Self-pay | Admitting: Psychology

## 2021-03-11 ENCOUNTER — Other Ambulatory Visit: Payer: Self-pay

## 2021-03-11 DIAGNOSIS — F331 Major depressive disorder, recurrent, moderate: Secondary | ICD-10-CM | POA: Insufficient documentation

## 2021-03-11 DIAGNOSIS — R419 Unspecified symptoms and signs involving cognitive functions and awareness: Secondary | ICD-10-CM | POA: Diagnosis not present

## 2021-03-11 DIAGNOSIS — F411 Generalized anxiety disorder: Secondary | ICD-10-CM | POA: Diagnosis present

## 2021-03-11 NOTE — Progress Notes (Signed)
   Neuropsychology Note  [name] completed 240 minutes of neuropsychological testing with this provider Health and safety inspector). The patient did not appear overtly distressed by the testing session, per behavioral observation or via self-report. Rest breaks were offered.   Clinical Decision Making: In considering the patient's current level of functioning, level of presumed impairment, nature of symptoms, emotional and behavioral responses during the interview, level of literacy, and observed level of motivation/effort, a battery of tests was selected. Changes were made as deemed necessary based on patient performance on testing, beobservations and additional pertinent factors such as those listed above.  Tests Administered:  . Olevia Bowens Depression Scale, 2nd Edition (BDI-II) . Comprehensive Attention Battery (CAB) . CAB Continuous Performance Test (CAB-CPT) . Generalized Anxiety Disorder 7 Item (GAD-7) . [place]ath . Trail Making Test (TMT; A & B) . Wechsler Adult Intelligence Scale, 4th Edition (WAIS-IV) . [place]ia Rating Scale for the Attention Deficit Hyperactivity Disorder  . [employer] Test [facility])  Results:  To be included once scored    Feedback to Patient: Kara Russo will return on 03/19/21 for an interactive feedback session with this provider at which time her test performances, clinical impressions and treatment recommendations will be reviewed in detail. The patient understands she can contact our office should she require our assistance before this time.   Full report to follow.

## 2021-03-13 ENCOUNTER — Telehealth: Payer: 59 | Admitting: Psychiatry
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
## 2021-03-14 ENCOUNTER — Encounter: Payer: Self-pay | Admitting: *Deleted

## 2021-03-14 ENCOUNTER — Encounter (HOSPITAL_BASED_OUTPATIENT_CLINIC_OR_DEPARTMENT_OTHER): Payer: 59 | Admitting: Psychology

## 2021-03-14 ENCOUNTER — Other Ambulatory Visit: Payer: Self-pay

## 2021-03-14 DIAGNOSIS — F330 Major depressive disorder, recurrent, mild: Secondary | ICD-10-CM

## 2021-03-14 DIAGNOSIS — R419 Unspecified symptoms and signs involving cognitive functions and awareness: Secondary | ICD-10-CM

## 2021-03-14 DIAGNOSIS — F411 Generalized anxiety disorder: Secondary | ICD-10-CM | POA: Diagnosis not present

## 2021-03-14 DIAGNOSIS — F331 Major depressive disorder, recurrent, moderate: Secondary | ICD-10-CM

## 2021-03-15 ENCOUNTER — Encounter: Payer: Self-pay | Admitting: Psychology

## 2021-03-15 NOTE — Progress Notes (Addendum)
NEUROPSYCHOLOGICAL EVALUATION   Name:    Kara Russo  Date of Birth:   17-Jan-1966 Date of Interview:  01/22/21 Date of Testing:  03/11/21   Date of Feedback:  03/19/21     Background Information:  Reason for Referral:  Kara Russo is a 55 y.o. female referred for neuropsychological evaluation by Dr. Modesta Messing (Psychiatry) to assess her current level of cognitive functioning and assist in differential diagnosis. The current evaluation consisted of a review of available medical records, an interview with the patient, and the completion of a neuropsychological testing battery. Informed consent was obtained.  History of Presenting Problem:  Patient reported primary difficulties with paying attention, racing thoughts, and competing tasks.    Onset/Course: Before 55 y.o./Chronic w/ gradual worsening    Patient described longstanding problems paying attention and staying focused in multiple areas of her life growing up. She recalled having trouble reading from a young age along with comprehension and writing difficulties. 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 She typically performed better in math. However, she was forced to repeat a pre-algebra course in 9th grade after receiving a failing grade.   Medical History:  Past Medical History:  Diagnosis Date  . Depression   . Hypothyroidism    Current medications:  Outpatient Encounter Medications as of 03/14/2021  Medication Sig  . buPROPion (WELLBUTRIN XL) 150 MG 24 hr tablet Take 1 tablet (150 mg total) by mouth daily.  . clindamycin (CLEOCIN) 150 MG capsule Take 1 capsule (150 mg total) by mouth 4 (four) times daily. (Patient not taking: Reported on 10/15/2017)  . FLUoxetine (PROZAC) 40 MG capsule Take 2 capsules (80 mg total) by mouth daily.  Marland Kitchen ibuprofen (ADVIL,MOTRIN) 600 MG tablet Take 1 tablet (600 mg total) by mouth every 8 (eight) hours as needed. (Patient not taking: Reported on  10/15/2017)  . levothyroxine (SYNTHROID) 100 MCG tablet Take 100 mcg by mouth daily before breakfast.   No facility-administered encounter medications on file as of 03/14/2021.   Current Examination:  Behavioral Observations:  Kara Russo completed 240 minutes of neuropsychological testing with this provider Health and safety inspector). The patient did not appear overtly distressed by the testing session, per behavioral observation or via self-report. Rest breaks were offered. She appeared highly anxious with pronounced psychomotor agitation.   Clinical Decision Making: In considering the patient's current level of functioning, level of presumed impairment, nature of symptoms, emotional and behavioral responses during the interview, level of literacy, and observed level of motivation/effort, a battery of tests was selected. Changes were made as deemed necessary based on patient performance on testing, beobservations and additional pertinent factors such as those listed above.  Tests Administered:   Beck Depression Scale, 2nd Edition (BDI-II)  Comprehensive Attention Battery (CAB)  CAB Continuous Performance Test (CAB-CPT)  Generalized Anxiety Disorder 7 Item (GAD-7)  [place] Multiphasic Personality Inventory, 2nd Edition (MMPI-II)- Not yet complete.   Trail Making Test (TMT; A & B)  Wechsler Adult Intelligence Scale, 4th Edition (WAIS-IV)  [place] Rating Scale for the Attention Deficit Hyperactivity Disorder   [employer] (WCST)  Test Results: Note: Standardized scores are presented only for use by appropriately trained professionals and to allow for any future test-retest comparison. These scores should not be interpreted without consideration of all the information that is contained in the rest of the report. The most recent standardization samples from the test publisher or other sources were used whenever  possible to derive standard scores; scores were  corrected for age, gender, ethnicity and education when available.   Test Scores:  TEST SCORES:  Note: This summary of test scores accompanies the interpretive report and should not be considered in isolation without reference to the appropriate sections in the text. Descriptors are based on appropriate normative data and may be adjusted based on clinical judgment. The terms "impaired" and "within normal limits (WNL)" are used when a more specific level of functioning cannot be determined.    Validity Testing:     Descriptor         WAIS-IV Reliable Digit Span: --- --- Within Expectation         Intellectual Functioning:               Wechsler Adult Intelligence Scale (WAIS-IV):  Standard Score/ Scaled Score Percentile    Verbal Comprehension Index: --- --- ---  Similarities  10 50 Average  Information  10 50 Average  Perceptual Reasoning Index:  --- --- ---  Matrix Reasoning  9 37 Average  Visual Puzzles 7 16 Below Average  Working Memory Index: 102 55 Average  Digit Span 10 50 Average  Arithmetic  11 63 Average  Processing Speed Index: 97 42 Average  Symbol Search  10 50 Average  Coding 9 37 Average         Wechsler Adult Intelligence Scale (WAIS-IV) Short Form*: Standard Score/ Scaled Score Percentile    Full Scale IQ  95 37 Average  Similarities 10 50 Average  Information  10 50 Average  Visual Puzzles 7 16 Below Average  Digit Span 10 50 Average  Coding 9 37 Average  *From [employer] (2009)             Wechsler Adult Intelligence Scale (WAIS-IV) Short Form*: Standard Score/ Scaled Score Percentile    Full Scale IQ 101 52 Average  Similarities 10 50 Average  Digit Span 10 50 Average  Arithmetic 11 63 Average  Information 10 50 Average  *From [employer] al. (2020)             Attention/Executive Function:               Trail Making Test (TMT): Raw Score (T Score) Percentile    Part A 31 secs., 0 errors (48) 42 Average  Part B 100 secs., 0 errors (40) 16 Below  Average           Scaled Score Percentile    WAIS-IV Coding: 9 37 Average           Scaled Score Percentile    WAIS-IV Digit Span: 10 50 Average  Forward 10 50 Average  Backward 10 50 Average  Sequencing 10 50 Average         [place] Card Sorting Test: Raw Score Percentile    Categories (trials) 4 (128) 11-16 Below Average  Total Errors 50 9 Below Average  % Perseverative Errors 20 16 Below Average  % Non-Perseverative Errors 20 9 Below Average  Failure to Maintain Set 1 >16 Average         Visuospatial/Visuoconstruction:                 Scaled Score Percentile    WAIS-IV Matrix Reasoning: 9 37 Average  WAIS-IV Visual Puzzles: 7 16 Below Average         Mood and Personality:          Raw Score Percentile    Olevia Bowens  Depression Inventory - II: 26 --- Moderate  GAD-7: 13 --- Moderate       Additional Questionnaires:          Raw Score Percentile    Adult Self-Report Scale (Current):      Inattention --- --- Somewhat likely to have ADHD  Hyperactive/Impulsive --- --- Somewhat likely to have ADHD  Adult Self-Report Scale (Childhood):      Inattention --- --- Somewhat likely to have ADHD  Hyperactive/Impulsive --- --- Somewhat likely to have ADHD          Description of Test Results:  Given her reported educational and occupational history, the patient's premorbid intellectual ability was estimated to be average. It is in this context that neuropsychological test results were interpreted.    The patient produced a 4 and 5 scale short form IQ score of 101 and 95 respectively, which is average for her age and consistent with predicted levels based on educational and occupational history. Language abilities were average. Visual analysis and integration was below average. Auditory attention and working memory were average. Psychomotor processing speed was also average. Verbal abstract reasoning was average. Non-verbal abstract reasoning was also average.  Her pure reaction time and  response rate on a comprehensive attention battery was found to be intact. The patient was administered the auditory/visual reaction time test.  The patient's accuracy scores were good as she correctly identified 49 of 50 targets and 50 of 50 targets for the visual and auditory versions of this measure, respectively. The patient's average response times were also equal to or better than normative comparisons.    The patient was then administered the discriminate reaction time measure. This test is comprised of 3 subtests that include a visual measure, and auditory measure, and a measure that require shifting between visual and auditory targets.  On the visual discriminate reaction time measure the patient correctly identified 35 of 35 targets with 1 error of commission; within normative expectations and suggests intact visual scanning and attention. The patient's response time was also equal to or better than normative expectations.  On the auditory discriminate reaction time test the patient correctly identified 33 of 35 targets with 2 errors of omission.  Average response times for the auditory measure was more than one standard deviation below normative expectations.  The patient's performance on the mixed discriminate reaction time, which requires cognitive shifting of attention between either auditory or visual targets, was slightly below expectation.  Patient correctly identified 26 of 30 targets with 2 errors of commission and 4 errors of omission.  Her average response time was within normative expectations.  Performance sores on measures of executive functioning were slightly variable with some relative weaknesses noted. On a paper-and-pencil task of connecting numbers in order, she performed in the average range, without any errors (TMT Part A=42nd percentile). When a switching component was included, her performance fell to the below average range but without any errors (TMT Part B=16th percentile).    The patient was then administered the [employer] [facility]). Overall performance was below expectation with some mild perseveration noted (Perseverative Errors=14th percentile). She only completed 4 out of 6 categories (11th-16th percentile) and failed to maintain set 1 time.    The patient was then administered the Stroop interference cancellation test. The patient performed more than one standard deviation below her normative comparison group on the first two non-interference trials but then improved on subsequent trials and when the interference was applied.  The patient was  slow to initiate responses on the non-interference trials and appeared to become startled once the test began. She did not display problems with set-shifting or distractibility. Behaviorally, she showed no social inappropriateness but did show some impulsivity.   On a self-report measure of mood, the patient's responses were indicative of at least a moderate degree of recent depression. Symptoms endorsed included sadness, anhedonia, irritability, indecisiveness, low self esteem, fatigue, and difficulty sleeping, reduced interest in sex. On a self-report measure of anxiety, the patient did endorse clinically significant generalized anxiety that is "somewhat difficult" at the present time (GAD-7=13/21)  On a self-report measure of symptoms associated with ADHD, her score of 47 is barely above suggested cut-off score of 46, shown to correctly classify 86% of patients with ADHD, 99% of normal persons, and 81% of depressed subjects in the original validation study (Ward MF, 1993)   Of note: Waiting for patient to complete additional assessment through secure online portal operated by [employer] (2022). Results will be integrated in this report once administration, scoring, and interpretation is complete.   Clinical Impressions: Major Depressive Disorder, recurrent, moderate; Generalized anxiety disorder. No cognitive  disorder. Results of cognitive testingwere generally within normal limits. There were no areas of impairment and most performances were average, with some in the below average range. Meanwhile, there is evidence of significant depression and anxiety. As such, the patient's subjective cognitive complaints are most likely secondary to depression and psychosocial stress. There is no evidence of a neurodevelopmental condition such as ADHD or cognitive based disorder at this time.   It is very likely that the patient's attentional issues in childhood were not actually related to attention deficit disorder but attributable to an underlying anxiety disorder that was in the process of becoming manifest.   As such, the patient is not likely to be a particularly good responder to psychostimulant medications as they could exacerbate her anxiety and cause heart problems. While she is currently being treated with a combination of NDRI and SSRI medications, these do not appear to be effectively managing her mood symptoms. She may benefit from an adjustment to current regimen or possibly try a different SSRI or SNRI. Participation in concurrent psychotherapy is warranted and highly recommended at least twice per month; if not weekly. I am hopeful that more aggressive treatment of her depression and anxiety will result in improved quality of life as well as enhanced cognitive function in daily life.There also would likely be benefit to paying close attention to good sleep hygiene as poor sleep is associated with her diagnostic pattern and cognitive complaints. Regular exercise and good eating patterns would also be quite beneficial for her overall.  I will provide feedback regarding the results of the current evaluation and provide specific recommendations to the patient.   Recommendations/Plan: Based on the findings of the present evaluation, the following recommendations are offered:  1. Treatment for  depression/anxiety: The patient is currently taking Wellbutrin XL (150mg ) and Prozac (40mg ) but this combination does not appear to be effectively managing her symptoms. I recommend that she continue counseling with social worker in combination with psychopharmacological intervention. She would likely benefit from CBT and applied relaxation. Motivational interviewing for smoking cessation should also be considered.   2. The patient will be reassured that her cognitive test results were within normal limits and not indicative of ADHD. These test results will serve as a nice baseline for future comparison if needed at any time.  Feedback to Patient: Kara Russo  returned for a feedback appointment on 03/19/2021 to review the results of her neuropsychological evaluation with this provider.    Thank you for your referral of Kara Russo. Please feel free to contact me if you have any questions or concerns regarding this report.

## 2021-03-18 ENCOUNTER — Other Ambulatory Visit: Payer: Self-pay

## 2021-03-18 ENCOUNTER — Ambulatory Visit: Payer: 59 | Admitting: Licensed Clinical Social Worker

## 2021-03-19 ENCOUNTER — Encounter: Payer: Self-pay | Admitting: Psychology

## 2021-03-19 ENCOUNTER — Encounter: Payer: 59 | Admitting: Psychology

## 2021-03-19 ENCOUNTER — Encounter: Payer: Self-pay | Admitting: Psychiatry

## 2021-03-19 ENCOUNTER — Other Ambulatory Visit: Payer: Self-pay

## 2021-03-19 ENCOUNTER — Telehealth (INDEPENDENT_AMBULATORY_CARE_PROVIDER_SITE_OTHER): Payer: 59 | Admitting: Psychiatry

## 2021-03-19 DIAGNOSIS — G4700 Insomnia, unspecified: Secondary | ICD-10-CM

## 2021-03-19 DIAGNOSIS — F411 Generalized anxiety disorder: Secondary | ICD-10-CM

## 2021-03-19 DIAGNOSIS — F331 Major depressive disorder, recurrent, moderate: Secondary | ICD-10-CM | POA: Diagnosis not present

## 2021-03-19 DIAGNOSIS — G2581 Restless legs syndrome: Secondary | ICD-10-CM | POA: Diagnosis not present

## 2021-03-19 DIAGNOSIS — R419 Unspecified symptoms and signs involving cognitive functions and awareness: Secondary | ICD-10-CM | POA: Diagnosis not present

## 2021-03-19 MED ORDER — BUPROPION HCL ER (XL) 300 MG PO TB24: 300.0000 mg | ORAL_TABLET | Freq: Every day | ORAL | 0 refills | Status: DC

## 2021-03-19 MED ORDER — FLUOXETINE HCL 20 MG PO CAPS: 60.0000 mg | ORAL_CAPSULE | Freq: Every day | ORAL | 0 refills | Status: DC

## 2021-03-19 NOTE — Progress Notes (Signed)
Neuropsychology Feedback Appointment  Kara Russo returned for a feedback appointment today to review the results of her recent neuropsychological evaluation with this provider. 60 minutes face-to-face time was spent reviewing her test results, my impressions and my recommendations as detailed in her report. Education was provided about MDD and GAD. The patient was given the opportunity to ask questions, and I did my best to answer these to her satisfaction.   Below is a copy of my clinical impressions and recommendations from the evaluation. The initial consult note can be found in patient's EMR dated 01/22/21 and the final report on 03/14/21.      NEUROPSYCHOLOGICAL EVALUATION    Description of Test Results: Given her reported educational and occupational history, the patient's premorbid intellectual ability was estimated to be average. It is in this context that neuropsychological test results were interpreted.    The patient produced a 4 and 5 scale short form IQ score of 101 and 95 respectively, which is average for her age and consistent with predicted levels based on educational and occupational history. Language abilities were average. Visual analysis and integration was below average. Auditory attention and working memory were average. Psychomotor processing speed was also average. Verbal abstract reasoning was average. Non-verbal abstract reasoning was also average.  Her pure reaction time and response rate on a comprehensive attention battery was found to be intact. The patient was administered the auditory/visual reaction time test.  The patient's accuracy scores were good as she correctly identified 49 of 50 targets and 50 of 50 targets for the visual and auditory versions of this measure, respectively. The patient's average response times were also equal to or better than normative comparisons.    The patient was then administered the discriminate reaction time measure. This  test is comprised of 3 subtests that include a visual measure, and auditory measure, and a measure that require shifting between visual and auditory targets.  On the visual discriminate reaction time measure the patient correctly identified 35 of 35 targets with 1 error of commission; within normative expectations and suggests intact visual scanning and attention. The patient's response time was also equal to or better than normative expectations.  On the auditory discriminate reaction time test the patient correctly identified 33 of 35 targets with 2 errors of omission.  Average response times for the auditory measure was more than one standard deviation below normative expectations.  The patient's performance on the mixed discriminate reaction time, which requires cognitive shifting of attention between either auditory or visual targets, was slightly below expectation.  Patient correctly identified 26 of 30 targets with 2 errors of commission and 4 errors of omission.  Her average response time was within normative expectations.  Performance sores on measures of executive functioning were slightly variable with some relative weaknesses noted. On a paper-and-pencil task of connecting numbers in order, she performed in the average range, without any errors (TMT Part A=42nd percentile). When a switching component was included, her performance fell to the below average range but without any errors (TMT Part B=16th percentile).   The patient was then administered the [facility]). Overall performance was below expectation with some mild perseveration noted (Perseverative Errors=14th percentile). She only completed 4 out of 6 categories (11th-16th percentile) and failed to maintain set 1 time.    The patient was then administered the Stroop interference cancellation test. The patient performed more than one standard deviation below her normative comparison group on the first two  non-interference trials but then improved on subsequent trials and when the interference was applied.  The patient was slow to initiate responses on the non-interference trials and appeared to become startled once the test began. She did not display problems with set-shifting or distractibility. Behaviorally, she showed no social inappropriateness but did show some impulsivity.   On a self-report measure of mood, the patient's responses were indicative of at least a moderate degree of recent depression. Symptoms endorsed included sadness, anhedonia, irritability, indecisiveness, low self esteem, fatigue, and difficulty sleeping, reduced interest in sex. On a self-report measure of anxiety, the patient did endorse clinically significant generalized anxiety that is "somewhat difficult" at the present time (GAD-7=13/21)  On a self-report measure of symptoms associated with ADHD, her score of 47 is barely above suggested cut-off score of 46, shown to correctly classify 86% of patients with ADHD, 99% of normal persons, and 81% of depressed subjects in the original validation study (Ward MF, 1993)   Of note: Waiting for patient to complete additional assessment through secure online portal operated by [employer] (2022). Results will be integrated in this report once administration, scoring, and interpretation is complete.   Clinical Impressions: Major Depressive Disorder, recurrent, moderate; Generalized anxiety disorder. No cognitive disorder. Results of cognitive testing were generally within normal limits. There were no areas of impairment and most performances were average, with some in the below average range. Meanwhile, there is evidence of significant depression and anxiety. As such, the patient's subjective cognitive complaints are most likely secondary to depression and psychosocial stress. There is no evidence of a neurodevelopmental condition such as ADHD or cognitive based disorder at this time.    It is very likely that the patient's attentional issues in childhood were not actually related to attention deficit disorder but attributable to an underlying anxiety disorder that was in the process of becoming manifest.   As such, the patient is not likely to be a particularly good responder to psychostimulant medications as they could exacerbate her anxiety and cause heart problems. While she is currently being treated with a combination of NDRI and SSRI medications, these do not appear to be effectively managing her mood symptoms. She may benefit from an adjustment to current regimen or possibly try a different SSRI or SNRI. Participation in concurrent psychotherapy is warranted and highly recommended at least twice per month; if not weekly. I am hopeful that more aggressive treatment of her depression and anxiety will result in improved quality of life as well as enhanced cognitive function in daily life. There also would likely be benefit to paying close attention to good sleep hygiene as poor sleep is associated with her diagnostic pattern and cognitive complaints. Regular exercise and good eating patterns would also be quite beneficial for her overall.  I will provide feedback regarding the results of the current evaluation and provide specific recommendations to the patient.    Recommendations/Plan: Based on the findings of the present evaluation, the following recommendations are offered:   1. Treatment for depression/anxiety: The patient is currently taking Wellbutrin XL (150mg ) and Prozac (40mg ) but this combination does not appear to be effectively managing her symptoms. I recommend that she continue counseling with social worker in combination with psychopharmacological intervention. She would likely benefit from CBT and applied relaxation. Motivational interviewing for smoking cessation should also be considered.    2. The patient will be reassured that her cognitive test results were  within normal limits and not indicative of ADHD. These  test results will serve as a nice baseline for future comparison if needed at any time.  Feedback to Patient: Kara Russo returned for a feedback appointment on 03/19/2021 to review the results of her neuropsychological evaluation with this provider.    Thank you for your referral of LINA [facility]. Please feel free to contact me if you have any questions or concerns regarding this report.

## 2021-04-02 ENCOUNTER — Telehealth: Payer: Self-pay

## 2021-04-02 NOTE — Telephone Encounter (Signed)
Noted. Please remind her to get blood test done (any [facility]).

## 2021-04-02 NOTE — Telephone Encounter (Signed)
left message that dr. Modesta Messing received message but that she wanted me to call you back to remind you to do your labwork at any [facility].

## 2021-04-02 NOTE — Telephone Encounter (Signed)
pt called states that she stopped the wellbutrin she could not sleep

## 2021-04-04 ENCOUNTER — Encounter: Payer: Self-pay | Admitting: Psychiatry

## 2021-04-04 LAB — BASIC METABOLIC PANEL
BUN/Creatinine Ratio: 13 (ref 9–23)
BUN: 9 mg/dL (ref 6–24)
CO2: 21 mmol/L (ref 20–29)
Calcium: 9.3 mg/dL (ref 8.7–10.2)
Chloride: 101 mmol/L (ref 96–106)
Creatinine, Ser: 0.69 mg/dL (ref 0.57–1.00)
Glucose: 104 mg/dL — ABNORMAL HIGH (ref 65–99)
Potassium: 4.2 mmol/L (ref 3.5–5.2)
Sodium: 138 mmol/L (ref 134–144)
eGFR: 102 mL/min/{1.73_m2} (ref >59)

## 2021-04-04 LAB — FERRITIN: Ferritin: 52 ng/mL (ref 15–150)
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
## 2021-04-15 ENCOUNTER — Telehealth: Payer: Self-pay

## 2021-04-15 NOTE — Telephone Encounter (Signed)
received fax to get a refill on both wellbutrin and on both fluoxetine

## 2021-04-15 NOTE — Telephone Encounter (Signed)
Will hold until the next appointment as we may change the dose

## 2021-04-19 ENCOUNTER — Other Ambulatory Visit: Payer: Self-pay

## 2021-04-19 ENCOUNTER — Telehealth (INDEPENDENT_AMBULATORY_CARE_PROVIDER_SITE_OTHER): Payer: 59 | Admitting: Psychiatry

## 2021-04-19 ENCOUNTER — Encounter: Payer: Self-pay | Admitting: Psychiatry

## 2021-04-19 DIAGNOSIS — F331 Major depressive disorder, recurrent, moderate: Secondary | ICD-10-CM | POA: Diagnosis not present

## 2021-04-19 MED ORDER — VENLAFAXINE HCL ER 75 MG PO CP24
ORAL_CAPSULE | ORAL | 0 refills | Status: DC
Start: 2021-04-27 — End: 2021-05-21

## 2021-04-19 MED ORDER — VENLAFAXINE HCL ER 37.5 MG PO CP24: 37.5000 mg | ORAL_CAPSULE | Freq: Every day | ORAL | 0 refills | Status: DC

## 2021-04-19 NOTE — Patient Instructions (Signed)
1.  Discontinue fluoxetine 2. Start venlafaxine 37.5 mg daily for one week, then 75 mg daily  3   Next appointment- 4/29 at 11 AM

## 2021-04-24 ENCOUNTER — Telehealth: Payer: 59 | Admitting: Psychiatry

## 2021-04-24 ENCOUNTER — Telehealth: Payer: Self-pay | Admitting: Psychiatry

## 2021-04-24 ENCOUNTER — Other Ambulatory Visit: Payer: Self-pay

## 2021-04-24 NOTE — Telephone Encounter (Signed)
Sent link for video visit through Epic. Patient did not sign in. Called the patient for appointment scheduled today. The patient did not answer the phone. Left voice message to contact the office ([phone]).   ?

## 2021-04-26 IMAGING — CT CT CHEST LUNG CANCER SCREENING LOW DOSE W/O CM
2 of 5 series · 15 of 40 positions shown, 18 images · non-contrast
Comparison: No priors.

CLINICAL DATA: 54-year-old female current smoker with 39 pack-year
history of smoking. Lung cancer screening examination.

EXAM:
CT CHEST WITHOUT CONTRAST LOW-DOSE FOR LUNG CANCER SCREENING
TECHNIQUE: Multidetector CT imaging of the chest was performed following the
standard protocol without IV contrast.

[Series 3: lung 1.00 · axial · 0.61mm/px · z∈[-1268,-934]mm · 12 of 370 slices shown, 15 images]
[im 18/370  mediastinal]
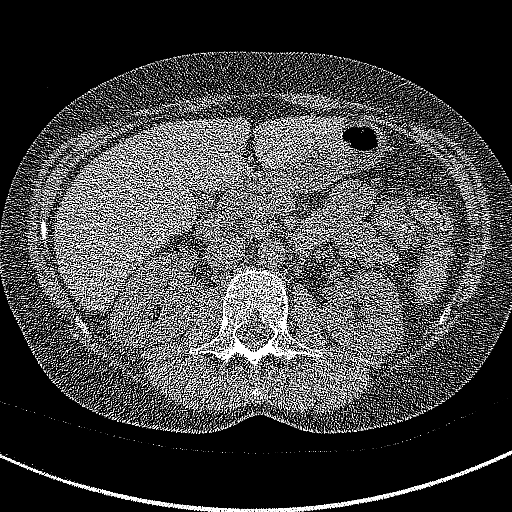
[im 18/370  lung]
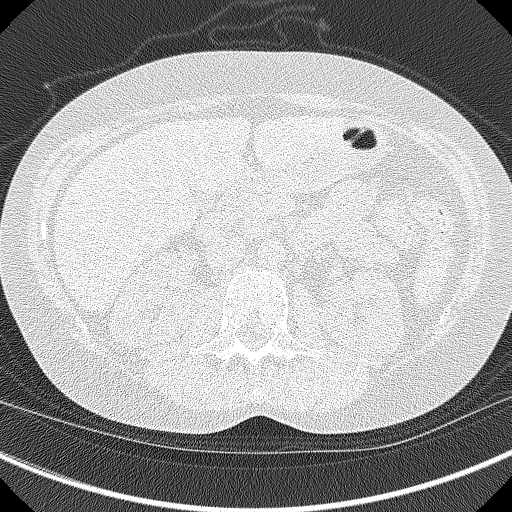
[im 53/370  lung]
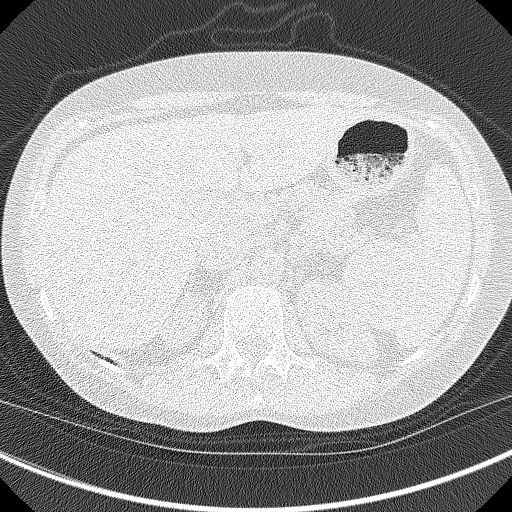
[im 88/370  lung]
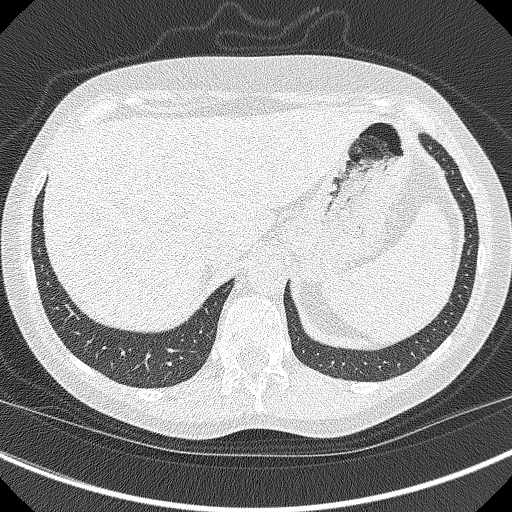
[im 106/370  lung]
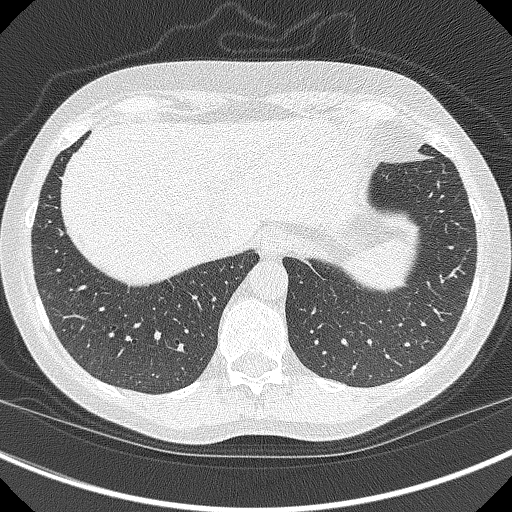
[im 141/370  mediastinal]
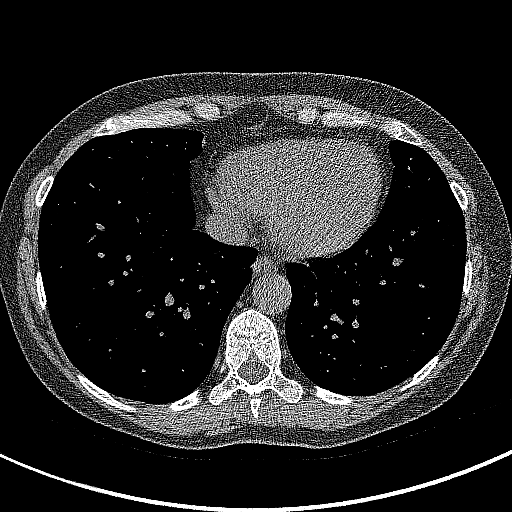
[im 141/370  lung]
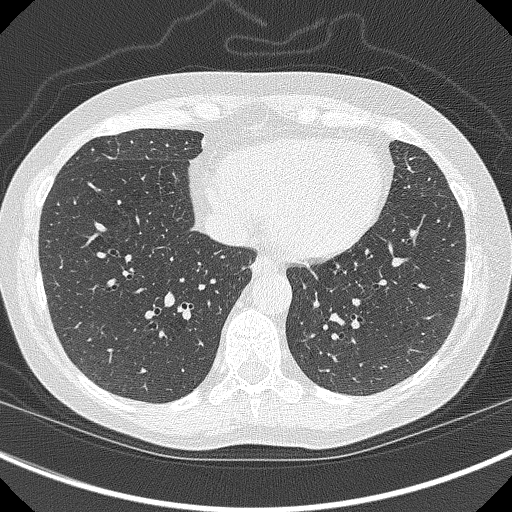
[im 176/370  lung]
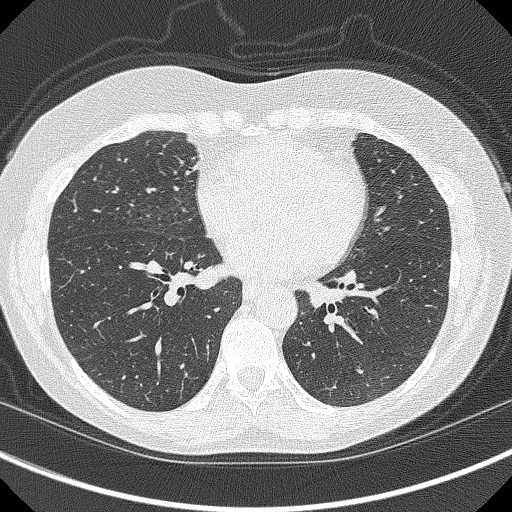
[im 194/370  lung]
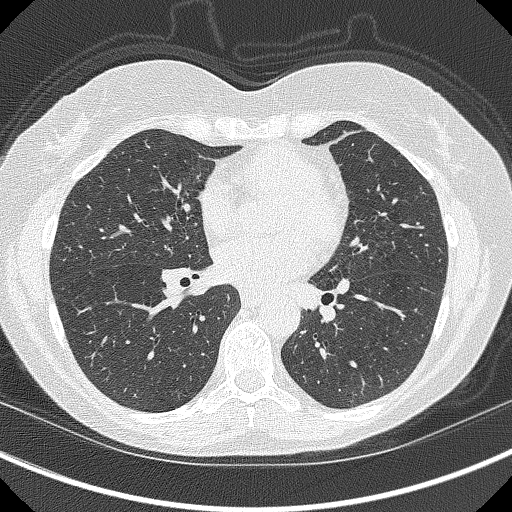
[im 229/370  lung]
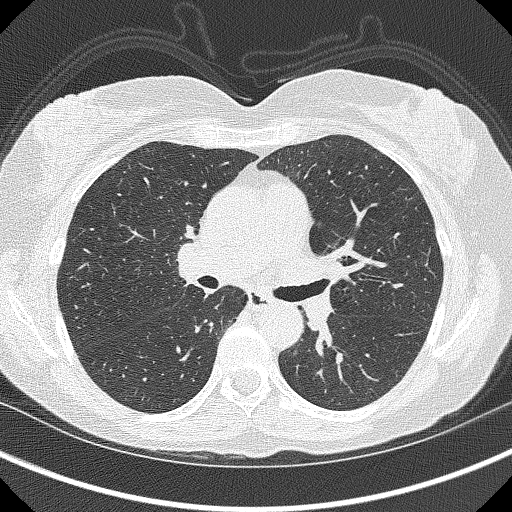
[im 264/370  mediastinal]
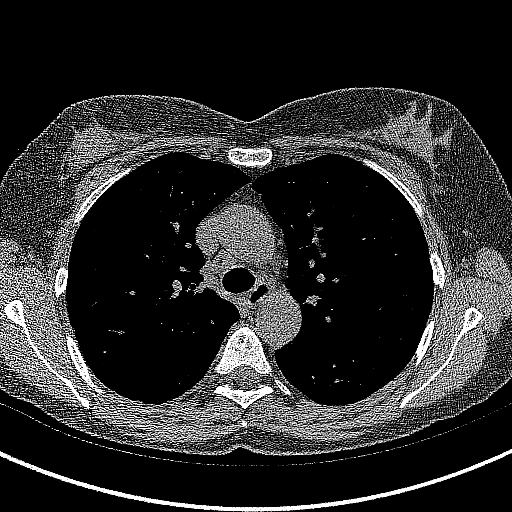
[im 264/370  lung]
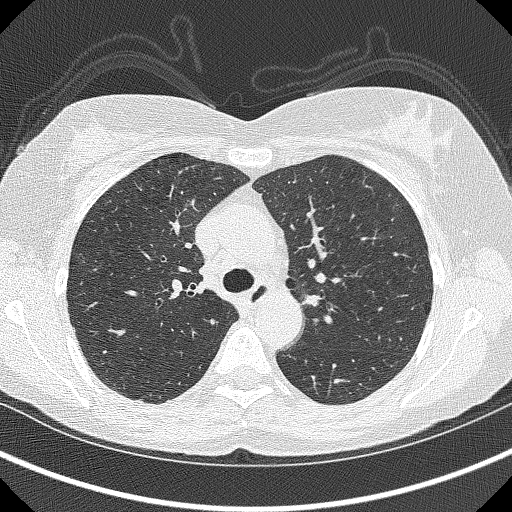
[im 282/370  lung]
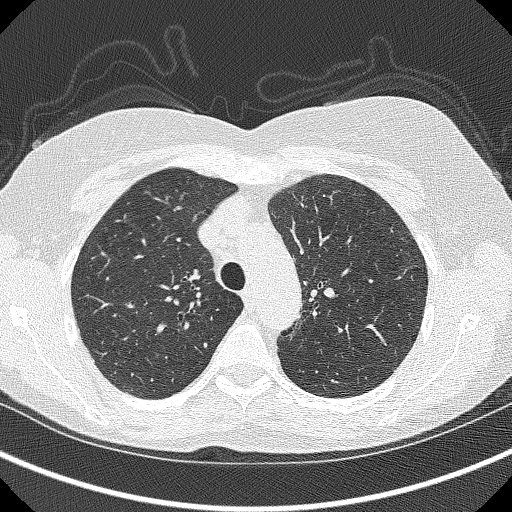
[im 317/370  lung]
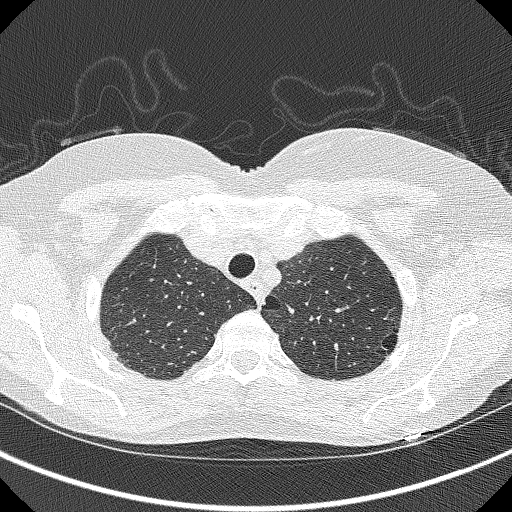
[im 352/370  lung]
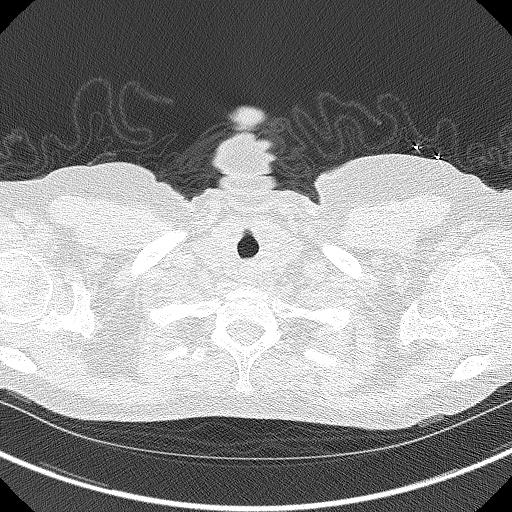

[Series 5: coronals lung 1.00 cor · coronal · 0.61mm/px · 3 of 248 slices shown]
[im 50/248  lung]
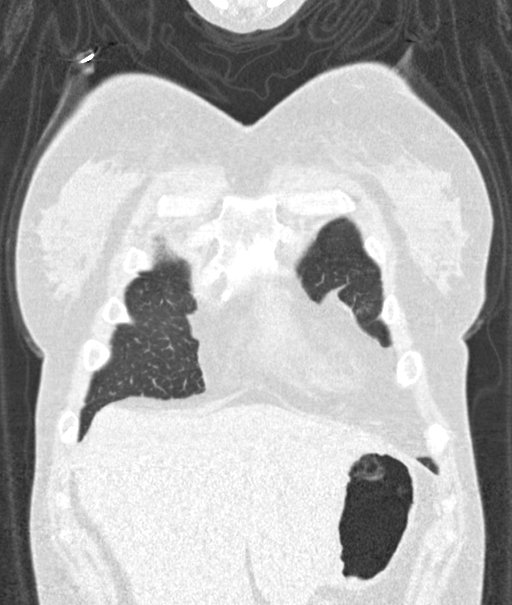
[im 99/248  lung]
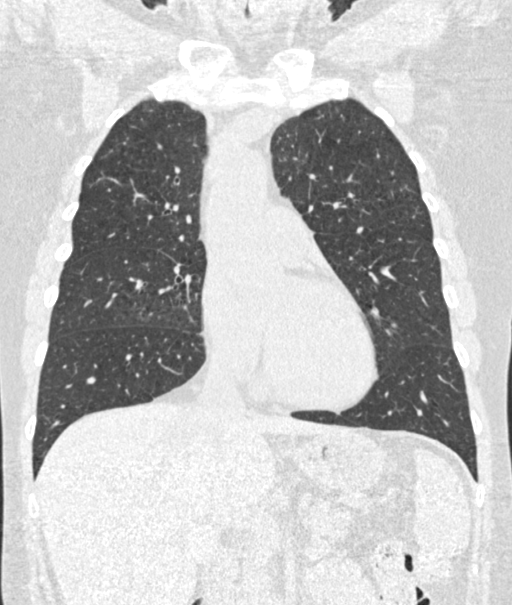
[im 149/248  lung]
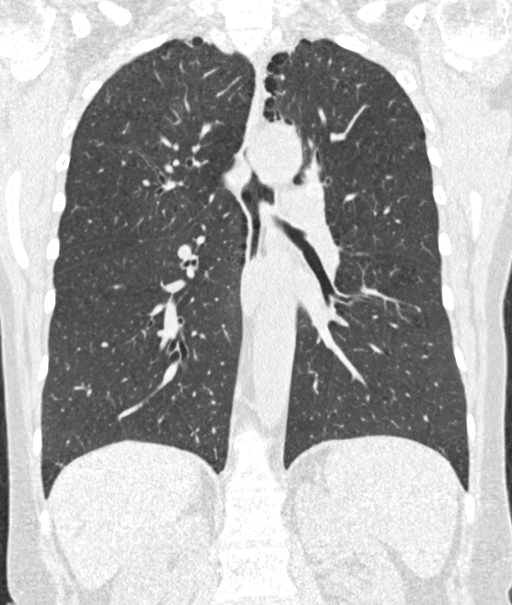

[15 of 40 positions shown; findings below may reference images not displayed]

FINDINGS: Cardiovascular: Heart size is normal. There is no significant
pericardial fluid, thickening or pericardial calcification. There is
aortic atherosclerosis, as well as atherosclerosis of the great
vessels of the mediastinum and the coronary arteries, including
calcified atherosclerotic plaque in the left anterior descending
coronary artery.

Mediastinum/Nodes: No pathologically enlarged mediastinal or hilar
lymph nodes. Please note that accurate exclusion of hilar adenopathy
is limited on noncontrast CT scans. Esophagus is unremarkable in
appearance. No axillary lymphadenopathy.

Lungs/Pleura: Small pulmonary nodules of which is in the anterior
aspect of the left upper lobe are noted in the lungs, largest near
the apex (axial image 332 of series 3), with a volume derived mean
diameter of 3.2 mm. No other larger more suspicious appearing
pulmonary nodules or masses are noted. No acute consolidative
airspace disease. No pleural effusions. Mild diffuse bronchial wall
thickening with mild centrilobular and paraseptal emphysema.

Upper Abdomen: Aortic atherosclerosis.

Musculoskeletal: There are no aggressive appearing lytic or blastic
lesions noted in the visualized portions of the skeleton.
IMPRESSION: 1. Lung-RADS 2S, benign appearance or behavior. Continue annual
screening with low-dose chest CT without contrast in 12 months.
2. The "S" modifier above refers to potentially clinically
significant non lung cancer related findings. Specifically, there is
aortic atherosclerosis, in addition to left anterior descending
coronary artery disease. Please note that although the presence of
coronary artery calcium documents the presence of coronary artery
disease, the severity of this disease and any potential stenosis
cannot be assessed on this non-gated CT examination. Assessment for
potential risk factor modification, dietary therapy or pharmacologic
therapy may be warranted, if clinically indicated.
3. Mild diffuse bronchial wall thickening with mild centrilobular
and paraseptal emphysema; imaging findings suggestive of underlying
COPD.

Aortic Atherosclerosis (CL08Q-KP0.0) and Emphysema (CL08Q-NIA.X).

## 2021-05-21 ENCOUNTER — Other Ambulatory Visit: Payer: Self-pay | Admitting: Psychiatry

## 2021-05-21 ENCOUNTER — Telehealth: Payer: Self-pay

## 2021-05-21 MED ORDER — VENLAFAXINE HCL ER 75 MG PO CP24: 75.0000 mg | ORAL_CAPSULE | Freq: Every day | ORAL | 0 refills | Status: DC

## 2021-05-21 NOTE — Telephone Encounter (Signed)
Refill ordered

## 2021-05-21 NOTE — Telephone Encounter (Signed)
received a fax requesting a refill on the venlafaxine 75mg 
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
## 2021-06-04 ENCOUNTER — Telehealth (INDEPENDENT_AMBULATORY_CARE_PROVIDER_SITE_OTHER): Payer: 59 | Admitting: Psychiatry

## 2021-06-04 ENCOUNTER — Other Ambulatory Visit: Payer: Self-pay

## 2021-06-04 ENCOUNTER — Encounter: Payer: Self-pay | Admitting: Psychiatry

## 2021-06-04 DIAGNOSIS — F3341 Major depressive disorder, recurrent, in partial remission: Secondary | ICD-10-CM | POA: Diagnosis not present

## 2021-06-04 DIAGNOSIS — F411 Generalized anxiety disorder: Secondary | ICD-10-CM

## 2021-06-04 MED ORDER — VENLAFAXINE HCL ER 150 MG PO CP24: 150.0000 mg | ORAL_CAPSULE | Freq: Every day | ORAL | 2 refills | Status: DC

## 2021-06-04 NOTE — Patient Instructions (Signed)
1.  Increase venlafaxine 150 mg daily 2   Next appointment- August 30 at 840

## 2021-08-07 ENCOUNTER — Telehealth: Payer: Self-pay | Admitting: Psychiatry
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
## 2021-08-20 ENCOUNTER — Encounter: Payer: Self-pay | Admitting: Psychiatry

## 2021-08-20 ENCOUNTER — Other Ambulatory Visit: Payer: Self-pay

## 2021-08-20 ENCOUNTER — Telehealth (INDEPENDENT_AMBULATORY_CARE_PROVIDER_SITE_OTHER): Payer: 59 | Admitting: Psychiatry

## 2021-08-20 DIAGNOSIS — F411 Generalized anxiety disorder: Secondary | ICD-10-CM | POA: Diagnosis not present

## 2021-08-20 DIAGNOSIS — F330 Major depressive disorder, recurrent, mild: Secondary | ICD-10-CM | POA: Diagnosis not present

## 2021-08-20 MED ORDER — VENLAFAXINE HCL ER 75 MG PO CP24: 225.0000 mg | ORAL_CAPSULE | Freq: Every day | ORAL | 1 refills | Status: DC

## 2021-08-20 NOTE — Patient Instructions (Signed)
1.  Increase venlafaxine 225 mg daily 3   Next appointment- 10/27 at 8 AM
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
## 2021-10-17 ENCOUNTER — Telehealth (INDEPENDENT_AMBULATORY_CARE_PROVIDER_SITE_OTHER): Payer: 59 | Admitting: Psychiatry

## 2021-10-17 ENCOUNTER — Encounter: Payer: Self-pay | Admitting: Psychiatry

## 2021-10-17 ENCOUNTER — Other Ambulatory Visit: Payer: Self-pay

## 2021-10-17 DIAGNOSIS — G2581 Restless legs syndrome: Secondary | ICD-10-CM | POA: Diagnosis not present

## 2021-10-17 DIAGNOSIS — F3341 Major depressive disorder, recurrent, in partial remission: Secondary | ICD-10-CM

## 2021-10-17 MED ORDER — VENLAFAXINE HCL ER 75 MG PO CP24: 225.0000 mg | ORAL_CAPSULE | Freq: Every day | ORAL | 2 refills | Status: DC

## 2021-10-17 NOTE — Patient Instructions (Signed)
Continue venlafaxine 225 mg daily Check ferritin  Next appointment- 1/25 at 8:20

## 2021-12-07 ENCOUNTER — Other Ambulatory Visit: Payer: Self-pay

## 2021-12-07 ENCOUNTER — Emergency Department: Payer: 59

## 2021-12-07 ENCOUNTER — Encounter: Payer: Self-pay | Admitting: Emergency Medicine

## 2021-12-07 ENCOUNTER — Emergency Department
Admission: EM | Admit: 2021-12-07 | Discharge: 2021-12-07 | Disposition: A | Discharge status: Home / Self Care | Payer: 59 | Attending: Emergency Medicine | Admitting: Emergency Medicine

## 2021-12-07 DIAGNOSIS — K5900 Constipation, unspecified: Secondary | ICD-10-CM

## 2021-12-07 DIAGNOSIS — F17210 Nicotine dependence, cigarettes, uncomplicated: Secondary | ICD-10-CM | POA: Insufficient documentation

## 2021-12-07 DIAGNOSIS — Z79899 Other long term (current) drug therapy: Secondary | ICD-10-CM | POA: Insufficient documentation

## 2021-12-07 DIAGNOSIS — R197 Diarrhea, unspecified: Secondary | ICD-10-CM | POA: Diagnosis not present

## 2021-12-07 DIAGNOSIS — E039 Hypothyroidism, unspecified: Secondary | ICD-10-CM | POA: Insufficient documentation

## 2021-12-07 DIAGNOSIS — R110 Nausea: Secondary | ICD-10-CM | POA: Diagnosis not present

## 2021-12-07 LAB — URINALYSIS, COMPLETE (UACMP) WITH MICROSCOPIC
Bilirubin Urine: NEGATIVE
Glucose, UA: NEGATIVE mg/dL
Ketones, ur: NEGATIVE mg/dL
Nitrite: NEGATIVE
Protein, ur: NEGATIVE mg/dL
Specific Gravity, Urine: 1.009 (ref 1.005–1.030)
pH: 6 (ref 5.0–8.0)

## 2021-12-07 LAB — COMPREHENSIVE METABOLIC PANEL
ALT: 8 U/L (ref 0–44)
AST: 16 U/L (ref 15–41)
Albumin: 3.6 g/dL (ref 3.5–5.0)
Alkaline Phosphatase: 82 U/L (ref 38–126)
Anion gap: 5 (ref 5–15)
BUN: 9 mg/dL (ref 6–20)
CO2: 26 mmol/L (ref 22–32)
Calcium: 8.8 mg/dL — ABNORMAL LOW (ref 8.9–10.3)
Chloride: 106 mmol/L (ref 98–111)
Creatinine, Ser: 0.55 mg/dL (ref 0.44–1.00)
GFR, Estimated: >60 mL/min (ref >60)
Glucose, Bld: 107 mg/dL — ABNORMAL HIGH (ref 70–99)
Potassium: 3.7 mmol/L (ref 3.5–5.1)
Sodium: 137 mmol/L (ref 135–145)
Total Bilirubin: 0.7 mg/dL (ref 0.3–1.2)
Total Protein: 6.8 g/dL (ref 6.5–8.1)

## 2021-12-07 LAB — CBC
HCT: 38 % (ref 36.0–46.0)
Hemoglobin: 12.6 g/dL (ref 12.0–15.0)
MCH: 32.6 pg (ref 26.0–34.0)
MCHC: 33.2 g/dL (ref 30.0–36.0)
MCV: 98.2 fL (ref 80.0–100.0)
Platelets: 177 10*3/uL (ref 150–400)
RBC: 3.87 MIL/uL (ref 3.87–5.11)
RDW: 12.8 % (ref 11.5–15.5)
WBC: 10 10*3/uL (ref 4.0–10.5)
nRBC: 0 % (ref 0.0–0.2)

## 2021-12-07 LAB — LIPASE, BLOOD: Lipase: 50 U/L (ref 11–51)

## 2021-12-07 MED ORDER — SENNA 8.6 MG PO TABS: 1.0000 | ORAL_TABLET | Freq: Every day | ORAL | 0 refills | Status: AC

## 2021-12-07 MED ORDER — DOCUSATE SODIUM 50 MG PO CAPS: 50.0000 mg | ORAL_CAPSULE | Freq: Two times a day (BID) | ORAL | 0 refills | Status: AC

## 2021-12-07 NOTE — ED Notes (Signed)
Pt back to room from CT

## 2021-12-07 NOTE — ED Triage Notes (Signed)
Pt reports no BM for 12 days and having some abd discomfort. Pt also reports not able to pass gas but is belching alot

## 2021-12-07 NOTE — Discharge Instructions (Addendum)
Your CAT scan did not show any bowel obstruction.  Your blood work was also reassuring.  Please start taking the senna and Colace in addition to the [place].  Return to the emergency department if you are developing worsening abdominal pain, vomiting or fevers.

## 2021-12-07 NOTE — ED Provider Notes (Signed)
[facility]  ____________________________________________   Event Date/Time   First MD Initiated Contact with Patient 12/07/21 0932     (approximate)  I have reviewed the triage vital signs and the nursing notes.   HISTORY  Chief Complaint Abdominal Pain and Constipation    HPI Kara Russo is a 55 y.o. female with past medical history of depression hypothyroidism who presents with the patient.  Symptoms started about 2 weeks ago.  On the fourth and the fifth she had what she believes was a viral illness, she had nausea and significant diarrhea.  Had about 6 bowel movements.  Since that time she has not had any bowel movement.  She says she is not passing gas just burping.  Has had nausea but no vomiting.  No urinary symptoms.  Normally she goes about every day.  She took Metamucil over the last 2 days and [place] suppository this morning without relief.  She denies abdominal pain.  Still eating and drinking but just less.  No history of prior abdominal surgeries.         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 No pertinent surgical history.  Prior to Admission medications   Medication Sig Start Date End Date Taking? Authorizing Provider  docusate sodium (COLACE) 50 MG capsule Take 1 capsule (50 mg total) by mouth 2 (two) times daily. 12/07/21 01/06/22 Yes Rada Hay, MD  senna (SENOKOT) 8.6 MG TABS tablet Take 1 tablet (8.6 mg total) by mouth daily. 12/07/21 01/06/22 Yes Rada Hay, MD  clindamycin (CLEOCIN) 150 MG capsule Take 1 capsule (150 mg total) by mouth 4 (four) times daily. Patient not taking: Reported on 10/15/2017 12/15/16   Sable Feil, PA-C  ibuprofen (ADVIL,MOTRIN) 600  MG tablet Take 1 tablet (600 mg total) by mouth every 8 (eight) hours as needed. Patient not taking: Reported on 10/15/2017 12/15/16   Sable Feil, PA-C  levothyroxine (SYNTHROID) 112 MCG tablet Take 112 mcg by mouth daily before breakfast. 09/26/16   [provider]  venlafaxine XR (EFFEXOR-XR) 75 MG 24 hr capsule Take 3 capsules (225 mg total) by mouth daily with breakfast. 10/21/21 01/19/22  Norman Clay, MD    Allergies Patient has no known allergies.  Family History  Adopted: Yes  Problem Relation Age of Onset   Bipolar disorder Mother     Social History Social History   Tobacco Use   Smoking status: Every Day    Packs/day: 1.00    Years: 39.00    Pack years: 39.00    Types: Cigarettes   Smokeless tobacco: Never  Substance Use Topics   Alcohol use: No   Drug use: No    Review of Systems   Review of Systems  Constitutional:  Negative for chills and fever.  Respiratory:  Negative for shortness of breath.   Cardiovascular:  Negative for chest pain.  Gastrointestinal:  Positive for constipation and nausea. Negative for abdominal pain, diarrhea and vomiting.  Genitourinary:  Negative for dysuria.  All other systems reviewed and are negative.  Physical Exam Updated Vital Signs BP 118/87 (BP Location: Left Arm)    Pulse 87    Temp 97.8 F (36.6 C) (Oral)    Resp  18    Ht 5\' 7"  (1.702 m)    Wt 72 kg    SpO2 94%    BMI 24.86 kg/m   Physical Exam Vitals and nursing note reviewed.  Constitutional:      General: She is not in acute distress.    Appearance: Normal appearance.  HENT:     Head: Normocephalic and atraumatic.  Eyes:     General: No scleral icterus.    Conjunctiva/sclera: Conjunctivae normal.  Pulmonary:     Effort: Pulmonary effort is normal. No respiratory distress.     Breath sounds: No stridor.  Abdominal:     General: Abdomen is flat.     Palpations: Abdomen is soft.     Tenderness: There is no abdominal tenderness.     Comments:  Abdomen is soft, nontender nondistended, no palpable masses  Musculoskeletal:        General: No deformity or signs of injury.     Cervical back: Normal range of motion.  Skin:    General: Skin is dry.     Coloration: Skin is not jaundiced or pale.  Neurological:     General: No focal deficit present.     Mental Status: She is alert and oriented to person, place, and time. Mental status is at baseline.  Psychiatric:        Mood and Affect: Mood normal.        Behavior: Behavior normal.     LABS (all labs ordered are listed, but only abnormal results are displayed)  Labs Reviewed  COMPREHENSIVE METABOLIC PANEL - Abnormal; Notable for the following components:      Result Value   Glucose, Bld 107 (*)    Calcium 8.8 (*)    All other components within normal limits  URINALYSIS, COMPLETE (UACMP) WITH MICROSCOPIC - Abnormal; Notable for the following components:   Color, Urine YELLOW (*)    APPearance CLEAR (*)    Hgb urine dipstick SMALL (*)    Leukocytes,Ua TRACE (*)    Bacteria, UA RARE (*)    All other components within normal limits  LIPASE, BLOOD  CBC   ____________________________________________  EKG  N/a ____________________________________________  RADIOLOGY Almeta Monas, personally viewed and evaluated these images (plain radiographs) as part of my medical decision making, as well as reviewing the written report by the radiologist.  ED MD interpretation: I reviewed the CT abdomen pelvis without contrast which does not show any bowel obstruction or other acute process    ____________________________________________   PROCEDURES  Procedure(s) performed (including Critical Care):  Procedures   ____________________________________________   INITIAL IMPRESSION / ASSESSMENT AND PLAN / ED COURSE     Patient is a 55 year old female presents with 12 days of constipation.  This was preceded by diarrhea.  She really has minimal abdominal pain.  She  is still tolerating p.o.  Not passing gas.  On exam she appears quite well and her abdominal exam overall is benign.  With a history of no bowel movement in for 12 days will obtain a CT abdomen pelvis without contrast to rule out obstruction however my suspicion is overall low given her benign exam.  Has not really had adequate trial of laxatives as an outpatient.  CT abdomen/pelvis does not show any bowel obstruction or other acute intra-abdominal process.  Her labs are all reassuring.  Will write for senna and Colace.  Discussed return precautions.  She is stable for discharge.  Clinical Course as of 12/07/21 1317  Sat Dec 07, 2021  1011 WBC: 10.0 [KM]  1021    Aortic Atherosclerosis (ICD10-I70.0).     Electronically Signed   By: Audie Pinto M.D.   On: 12/07/2021 10:18     External Documents Type Date Time User           Other Orders Result Date Reviewed Status Standing/Future? Diet NPO time specified [[id number]]       Standing Lipase, blood [[id number]]       Standing Comprehensive metabolic panel [[id number]]       Standing CBC [[id number]]       Standing DG Abd 2 Views [[id number]]       Standing Diet NPO time specified [[id number]]         Lipase, blood [[id number]] 12/07/21   Final result   Comprehensive metabolic panel [[id number]] 12/07/21   Final result   CBC [[id number]] 12/07/21   Final result   DG Abd 2 Views [[id number]] 12/07/21   Final result   Urinalysis, Complete w Microscopic [[id number]]       Standing Urinalysis, Complete w Microscopic [[id number]] 12/07/21   Final result   CT ABDOMEN PELVIS WO CONTRAST [[id number]]       Standing     [KM]    Clinical Course User Index [KM] Rada Hay, MD     ____________________________________________   FINAL CLINICAL IMPRESSION(S) / ED DIAGNOSES  Final diagnoses:  Constipation     ED Discharge Orders          Ordered    docusate sodium (COLACE) 50 MG capsule  2 times daily        12/07/21 1025     senna (SENOKOT) 8.6 MG TABS tablet  Daily        12/07/21 1025             Note:  This document was prepared using Dragon voice recognition software and may include unintentional dictation errors.    Rada Hay, MD 12/07/21 [phone]

## 2021-12-09 ENCOUNTER — Other Ambulatory Visit: Payer: Self-pay

## 2021-12-09 ENCOUNTER — Emergency Department
Admission: EM | Admit: 2021-12-09 | Discharge: 2021-12-09 | Disposition: A | Discharge status: Home / Self Care | Payer: 59 | Attending: Emergency Medicine | Admitting: Emergency Medicine

## 2021-12-09 DIAGNOSIS — E039 Hypothyroidism, unspecified: Secondary | ICD-10-CM | POA: Insufficient documentation

## 2021-12-09 DIAGNOSIS — F17210 Nicotine dependence, cigarettes, uncomplicated: Secondary | ICD-10-CM | POA: Diagnosis not present

## 2021-12-09 DIAGNOSIS — R109 Unspecified abdominal pain: Secondary | ICD-10-CM | POA: Insufficient documentation

## 2021-12-09 DIAGNOSIS — Z79899 Other long term (current) drug therapy: Secondary | ICD-10-CM | POA: Diagnosis not present

## 2021-12-09 DIAGNOSIS — K5900 Constipation, unspecified: Secondary | ICD-10-CM | POA: Insufficient documentation

## 2021-12-09 DIAGNOSIS — R112 Nausea with vomiting, unspecified: Secondary | ICD-10-CM | POA: Insufficient documentation

## 2021-12-09 LAB — CBC
HCT: 42.1 % (ref 36.0–46.0)
Hemoglobin: 14.6 g/dL (ref 12.0–15.0)
MCH: 32.6 pg (ref 26.0–34.0)
MCHC: 34.7 g/dL (ref 30.0–36.0)
MCV: 94 fL (ref 80.0–100.0)
Platelets: 225 10*3/uL (ref 150–400)
RBC: 4.48 MIL/uL (ref 3.87–5.11)
RDW: 12.6 % (ref 11.5–15.5)
WBC: 9.2 10*3/uL (ref 4.0–10.5)
nRBC: 0 % (ref 0.0–0.2)

## 2021-12-09 LAB — URINALYSIS, ROUTINE W REFLEX MICROSCOPIC
Bilirubin Urine: NEGATIVE
Glucose, UA: NEGATIVE mg/dL
Hgb urine dipstick: NEGATIVE
Ketones, ur: 5 mg/dL — AB
Leukocytes,Ua: NEGATIVE
Nitrite: NEGATIVE
Protein, ur: NEGATIVE mg/dL
Specific Gravity, Urine: 1.013 (ref 1.005–1.030)
pH: 6 (ref 5.0–8.0)

## 2021-12-09 LAB — LIPASE, BLOOD: Lipase: 25 U/L (ref 11–51)

## 2021-12-09 LAB — COMPREHENSIVE METABOLIC PANEL
ALT: 12 U/L (ref 0–44)
AST: 18 U/L (ref 15–41)
Albumin: 4 g/dL (ref 3.5–5.0)
Alkaline Phosphatase: 102 U/L (ref 38–126)
Anion gap: 9 (ref 5–15)
BUN: 7 mg/dL (ref 6–20)
CO2: 22 mmol/L (ref 22–32)
Calcium: 9.5 mg/dL (ref 8.9–10.3)
Chloride: 106 mmol/L (ref 98–111)
Creatinine, Ser: 0.62 mg/dL (ref 0.44–1.00)
GFR, Estimated: >60 mL/min (ref >60)
Glucose, Bld: 140 mg/dL — ABNORMAL HIGH (ref 70–99)
Potassium: 3.7 mmol/L (ref 3.5–5.1)
Sodium: 137 mmol/L (ref 135–145)
Total Bilirubin: 0.7 mg/dL (ref 0.3–1.2)
Total Protein: 7.6 g/dL (ref 6.5–8.1)

## 2021-12-09 MED ORDER — ONDANSETRON 4 MG PO TBDP
ORAL_TABLET | ORAL | Status: AC
  Filled 2021-12-09: qty 1

## 2021-12-09 MED ORDER — ONDANSETRON 4 MG PO TBDP
4.0000 mg | ORAL_TABLET | Freq: Once | ORAL | Status: AC
  Administered 2021-12-09: 09:00:00 4 mg via ORAL

## 2021-12-09 NOTE — ED Provider Notes (Signed)
[facility] Emergency Department Provider Note  Time seen: 9:27 AM  I have reviewed the triage vital signs and the nursing notes.   HISTORY  Chief Complaint Abdominal Pain and Constipation   HPI Kara Russo is a 55 y.o. female with a past medical history of depression, hypothyroidism, presents to the emergency department with concerns of intermittent abdominal pain and constipation.  According to the patient she has not had a bowel movement since 11/25/2021.  She clarifies and states she has had several very very small bowel movements.  Patient was seen in the emergency department 2 days ago at that time per record review patient had overall reassuring work-up including a CT scan of the abdomen and pelvis showing no signs of SBO, etc.  Patient has taken stool softener and MiraLAX without relief so she presented back to the emergency department today.  Patient states she was nauseated with 1 episode of vomiting today.  No dysuria.  No fever.   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 No pertinent surgical history.  Prior to Admission medications   Medication Sig Start Date End Date Taking? Authorizing Provider  clindamycin (CLEOCIN) 150 MG capsule Take 1 capsule (150 mg total) by mouth 4 (four) times daily. Patient not taking: Reported on 10/15/2017 12/15/16   Sable Feil, PA-C  docusate sodium (COLACE) 50 MG capsule Take 1 capsule (50 mg total) by mouth 2 (two) times daily. 12/07/21 01/06/22  Rada Hay, MD  ibuprofen (ADVIL,MOTRIN) 600 MG tablet Take 1 tablet (600 mg total) by mouth every 8 (eight) hours as needed. Patient not taking: Reported on 10/15/2017 12/15/16   Sable Feil, PA-C   levothyroxine (SYNTHROID) 112 MCG tablet Take 112 mcg by mouth daily before breakfast. 09/26/16   [provider]  senna (SENOKOT) 8.6 MG TABS tablet Take 1 tablet (8.6 mg total) by mouth daily. 12/07/21 01/06/22  Rada Hay, MD  venlafaxine XR (EFFEXOR-XR) 75 MG 24 hr capsule Take 3 capsules (225 mg total) by mouth daily with breakfast. 10/21/21 01/19/22  Norman Clay, MD    No Known Allergies  Family History  Adopted: Yes  Problem Relation Age of Onset   Bipolar disorder Mother     Social History Social History   Tobacco Use   Smoking status: Every Day    Packs/day: 1.00    Years: 39.00    Pack years: 39.00    Types: Cigarettes   Smokeless tobacco: Never  Substance Use Topics   Alcohol use: No   Drug use: No    Review of Systems Constitutional: Negative for fever. Cardiovascular: Negative for chest pain. Respiratory: Negative for shortness of breath. Gastrointestinal: Intermittent abdominal pain, diffuse.  1 episode of vomiting today.  Positive for constipation. Genitourinary: Negative for urinary compaints Musculoskeletal: Negative for musculoskeletal complaints Neurological: Negative for headache All other ROS negative  ____________________________________________   PHYSICAL EXAM:  VITAL SIGNS: ED Triage Vitals  Enc Vitals Group     BP 12/09/21 0817 (!) 151/105     Pulse Rate 12/09/21 0817 92     Resp 12/09/21 0817 20     Temp 12/09/21 0817 (!) 97.5 F (36.4 C)     Temp Source 12/09/21 0817  Oral     SpO2 12/09/21 0817 95 %     Weight 12/09/21 0818 159 lb (72.1 kg)     Height 12/09/21 0818 5\' 7"  (1.702 m)     Head Circumference --      Peak Flow --      Pain Score 12/09/21 0818 4     Pain Loc --      Pain Edu? --      Excl. in [place]? --    Constitutional: Alert and oriented. Well appearing and in no distress. Eyes: Normal exam ENT      Head: Normocephalic and atraumatic.      Mouth/Throat: Mucous membranes are moist. Cardiovascular:  Normal rate, regular rhythm.  Respiratory: Normal respiratory effort without tachypnea nor retractions. Breath sounds are clear  Gastrointestinal: Soft and nontender. No distention.   Musculoskeletal: Nontender with normal range of motion in all extremities.  Neurologic:  Normal speech and language. No gross focal neurologic deficit Skin:  Skin is warm, dry and intact.  Psychiatric: Mood and affect are normal.   ____________________________________________   INITIAL IMPRESSION / ASSESSMENT AND PLAN / ED COURSE  Pertinent labs & imaging results that were available during my care of the patient were reviewed by me and considered in my medical decision making (see chart for details).   Patient presents emergency department for continued constipation despite using Colace and MiraLAX yesterday.  Patient states intermittent abdominal pain.  Overall patient appears well, reassuring vital signs.  Benign abdominal exam.  Lab work has resulted normal.  I personally reviewed the CT images from 2 days ago.  Patient appears to have significant constipation on the CT scan including her left transverse and ascending colon, but no signs of blockage.  I spoke to the patient regarding various options including enema.  Patient would like to proceed with a bowel prep.  I discussed how to do a proper MiraLAX bowel prep, will be slightly less aggressive and do a half prep.  Patient agreeable to plan of care.  I discussed with the patient return precautions if her abdominal pain returns worsens despite the treatment or if she spikes a fever, is vomiting unable to handle fluids, etc.  Patient agreeable to plan of care.  We will follow-up with her doctor.  Kara Russo was evaluated in Emergency Department on 12/09/2021 for the symptoms described in the history of present illness. She was evaluated in the context of the global COVID-19 pandemic, which necessitated consideration that the patient might be at risk for  infection with the SARS-CoV-2 virus that causes COVID-19. Institutional protocols and algorithms that pertain to the evaluation of patients at risk for COVID-19 are in a state of rapid change based on information released by regulatory bodies including the CDC and federal and state organizations. These policies and algorithms were followed during the patient's care in the ED.  ____________________________________________   FINAL CLINICAL IMPRESSION(S) / ED DIAGNOSES  Constipation Abdominal pain   Harvest Dark, MD 12/09/21 [phone]

## 2021-12-09 NOTE — ED Triage Notes (Signed)
Pt c/o upper abd pain with nausea and dizziness , states she has not had a BM since 12/5, was seen here 2 days ago for the same and given Rx, states she has not had any relief and sx are worse

## 2021-12-09 NOTE — Discharge Instructions (Signed)
As we discussed please begin your constipation regimen (bowel prep) as soon as possible.  The constipation regimen is as stated below: Take four Dulcolax laxative tablets containing 5mg  of bisacodyl each (NOT Dulcolax stool softener). Mix approximately 1/2 of an 8.3-ounce bottle of MiraLAX (238 grams) or generic equivalent, with two 32-ounce bottles of Gatorade.  Drink over 8 hours.  If you begin having significant diarrhea you may stop the bowel prep.    Please follow up with your doctor.

## 2021-12-09 NOTE — ED Notes (Signed)
Pt noted to be vomiting in waiting room, meds given per protocol.

## 2021-12-23 DIAGNOSIS — M9905 Segmental and somatic dysfunction of pelvic region: Secondary | ICD-10-CM | POA: Diagnosis not present

## 2021-12-23 DIAGNOSIS — M62830 Muscle spasm of back: Secondary | ICD-10-CM | POA: Diagnosis not present

## 2021-12-23 DIAGNOSIS — M5416 Radiculopathy, lumbar region: Secondary | ICD-10-CM | POA: Diagnosis not present

## 2021-12-23 DIAGNOSIS — M9903 Segmental and somatic dysfunction of lumbar region: Secondary | ICD-10-CM | POA: Diagnosis not present

## 2021-12-26 DIAGNOSIS — M9905 Segmental and somatic dysfunction of pelvic region: Secondary | ICD-10-CM | POA: Diagnosis not present

## 2021-12-26 DIAGNOSIS — M5416 Radiculopathy, lumbar region: Secondary | ICD-10-CM | POA: Diagnosis not present

## 2021-12-26 DIAGNOSIS — M9903 Segmental and somatic dysfunction of lumbar region: Secondary | ICD-10-CM | POA: Diagnosis not present

## 2021-12-26 DIAGNOSIS — M62830 Muscle spasm of back: Secondary | ICD-10-CM | POA: Diagnosis not present

## 2022-01-01 DIAGNOSIS — K6289 Other specified diseases of anus and rectum: Secondary | ICD-10-CM | POA: Diagnosis not present

## 2022-01-01 DIAGNOSIS — D128 Benign neoplasm of rectum: Secondary | ICD-10-CM | POA: Diagnosis not present

## 2022-01-01 DIAGNOSIS — K6389 Other specified diseases of intestine: Secondary | ICD-10-CM | POA: Diagnosis not present

## 2022-01-01 DIAGNOSIS — K2970 Gastritis, unspecified, without bleeding: Secondary | ICD-10-CM | POA: Diagnosis not present

## 2022-01-01 DIAGNOSIS — K2980 Duodenitis without bleeding: Secondary | ICD-10-CM | POA: Diagnosis not present

## 2022-01-01 DIAGNOSIS — K2990 Gastroduodenitis, unspecified, without bleeding: Secondary | ICD-10-CM | POA: Diagnosis not present

## 2022-01-03 DIAGNOSIS — G4733 Obstructive sleep apnea (adult) (pediatric): Secondary | ICD-10-CM | POA: Diagnosis not present

## 2022-01-06 DIAGNOSIS — M9903 Segmental and somatic dysfunction of lumbar region: Secondary | ICD-10-CM | POA: Diagnosis not present

## 2022-01-06 DIAGNOSIS — M5416 Radiculopathy, lumbar region: Secondary | ICD-10-CM | POA: Diagnosis not present

## 2022-01-06 DIAGNOSIS — M9905 Segmental and somatic dysfunction of pelvic region: Secondary | ICD-10-CM | POA: Diagnosis not present

## 2022-01-06 DIAGNOSIS — M62830 Muscle spasm of back: Secondary | ICD-10-CM | POA: Diagnosis not present

## 2022-01-07 DIAGNOSIS — G4733 Obstructive sleep apnea (adult) (pediatric): Secondary | ICD-10-CM | POA: Diagnosis not present

## 2022-01-08 ENCOUNTER — Other Ambulatory Visit: Payer: Self-pay | Admitting: Infectious Diseases

## 2022-01-08 DIAGNOSIS — Z1231 Encounter for screening mammogram for malignant neoplasm of breast: Secondary | ICD-10-CM

## 2022-01-13 DIAGNOSIS — M62830 Muscle spasm of back: Secondary | ICD-10-CM | POA: Diagnosis not present

## 2022-01-13 DIAGNOSIS — M9903 Segmental and somatic dysfunction of lumbar region: Secondary | ICD-10-CM | POA: Diagnosis not present

## 2022-01-13 DIAGNOSIS — M5416 Radiculopathy, lumbar region: Secondary | ICD-10-CM | POA: Diagnosis not present

## 2022-01-13 DIAGNOSIS — M9905 Segmental and somatic dysfunction of pelvic region: Secondary | ICD-10-CM | POA: Diagnosis not present

## 2022-01-15 ENCOUNTER — Other Ambulatory Visit: Payer: Self-pay

## 2022-01-15 ENCOUNTER — Telehealth (INDEPENDENT_AMBULATORY_CARE_PROVIDER_SITE_OTHER): Payer: 59 | Admitting: Psychiatry

## 2022-01-15 ENCOUNTER — Encounter: Payer: Self-pay | Admitting: Psychiatry

## 2022-01-15 DIAGNOSIS — G2581 Restless legs syndrome: Secondary | ICD-10-CM | POA: Diagnosis not present

## 2022-01-15 DIAGNOSIS — R69 Illness, unspecified: Secondary | ICD-10-CM | POA: Diagnosis not present

## 2022-01-15 DIAGNOSIS — F3341 Major depressive disorder, recurrent, in partial remission: Secondary | ICD-10-CM | POA: Diagnosis not present

## 2022-01-15 MED ORDER — VENLAFAXINE HCL ER 75 MG PO CP24: 225.0000 mg | ORAL_CAPSULE | Freq: Every day | ORAL | 2 refills | Status: DC
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
## 2022-01-15 NOTE — Patient Instructions (Addendum)
Continue venlafaxine 225 mg daily Check ferritin  Next appointment- 4/10 at 1:20, video Referral to therapy

## 2022-01-20 DIAGNOSIS — M9905 Segmental and somatic dysfunction of pelvic region: Secondary | ICD-10-CM | POA: Diagnosis not present

## 2022-01-20 DIAGNOSIS — M5416 Radiculopathy, lumbar region: Secondary | ICD-10-CM | POA: Diagnosis not present

## 2022-01-20 DIAGNOSIS — M9903 Segmental and somatic dysfunction of lumbar region: Secondary | ICD-10-CM | POA: Diagnosis not present

## 2022-01-20 DIAGNOSIS — M62830 Muscle spasm of back: Secondary | ICD-10-CM | POA: Diagnosis not present

## 2022-01-27 DIAGNOSIS — M9905 Segmental and somatic dysfunction of pelvic region: Secondary | ICD-10-CM | POA: Diagnosis not present

## 2022-01-27 DIAGNOSIS — M9903 Segmental and somatic dysfunction of lumbar region: Secondary | ICD-10-CM | POA: Diagnosis not present

## 2022-01-27 DIAGNOSIS — M62830 Muscle spasm of back: Secondary | ICD-10-CM | POA: Diagnosis not present

## 2022-01-27 DIAGNOSIS — M5416 Radiculopathy, lumbar region: Secondary | ICD-10-CM | POA: Diagnosis not present

## 2022-02-03 DIAGNOSIS — M5416 Radiculopathy, lumbar region: Secondary | ICD-10-CM | POA: Diagnosis not present

## 2022-02-03 DIAGNOSIS — M62830 Muscle spasm of back: Secondary | ICD-10-CM | POA: Diagnosis not present

## 2022-02-03 DIAGNOSIS — M9905 Segmental and somatic dysfunction of pelvic region: Secondary | ICD-10-CM | POA: Diagnosis not present

## 2022-02-03 DIAGNOSIS — M9903 Segmental and somatic dysfunction of lumbar region: Secondary | ICD-10-CM | POA: Diagnosis not present

## 2022-02-07 DIAGNOSIS — G4733 Obstructive sleep apnea (adult) (pediatric): Secondary | ICD-10-CM | POA: Diagnosis not present

## 2022-02-10 DIAGNOSIS — R69 Illness, unspecified: Secondary | ICD-10-CM | POA: Diagnosis not present

## 2022-02-10 DIAGNOSIS — R053 Chronic cough: Secondary | ICD-10-CM | POA: Diagnosis not present

## 2022-02-10 DIAGNOSIS — K219 Gastro-esophageal reflux disease without esophagitis: Secondary | ICD-10-CM | POA: Diagnosis not present

## 2022-02-10 DIAGNOSIS — G4733 Obstructive sleep apnea (adult) (pediatric): Secondary | ICD-10-CM | POA: Diagnosis not present

## 2022-02-10 DIAGNOSIS — R490 Dysphonia: Secondary | ICD-10-CM | POA: Diagnosis not present

## 2022-02-11 ENCOUNTER — Other Ambulatory Visit: Payer: Self-pay

## 2022-02-11 ENCOUNTER — Ambulatory Visit
Admission: RE | Admit: 2022-02-11 | Discharge: 2022-02-11 | Disposition: A | Discharge status: Home / Self Care | Payer: 59 | Source: Ambulatory Visit | Attending: Infectious Diseases | Admitting: Infectious Diseases

## 2022-02-11 DIAGNOSIS — Z1231 Encounter for screening mammogram for malignant neoplasm of breast: Secondary | ICD-10-CM | POA: Diagnosis not present

## 2022-02-17 DIAGNOSIS — M9903 Segmental and somatic dysfunction of lumbar region: Secondary | ICD-10-CM | POA: Diagnosis not present

## 2022-02-17 DIAGNOSIS — M9905 Segmental and somatic dysfunction of pelvic region: Secondary | ICD-10-CM | POA: Diagnosis not present

## 2022-02-17 DIAGNOSIS — M62830 Muscle spasm of back: Secondary | ICD-10-CM | POA: Diagnosis not present

## 2022-02-17 DIAGNOSIS — M5416 Radiculopathy, lumbar region: Secondary | ICD-10-CM | POA: Diagnosis not present

## 2022-03-03 DIAGNOSIS — M9905 Segmental and somatic dysfunction of pelvic region: Secondary | ICD-10-CM | POA: Diagnosis not present

## 2022-03-03 DIAGNOSIS — R634 Abnormal weight loss: Secondary | ICD-10-CM | POA: Diagnosis not present

## 2022-03-03 DIAGNOSIS — E039 Hypothyroidism, unspecified: Secondary | ICD-10-CM | POA: Diagnosis not present

## 2022-03-03 DIAGNOSIS — M5416 Radiculopathy, lumbar region: Secondary | ICD-10-CM | POA: Diagnosis not present

## 2022-03-03 DIAGNOSIS — Z8619 Personal history of other infectious and parasitic diseases: Secondary | ICD-10-CM | POA: Diagnosis not present

## 2022-03-03 DIAGNOSIS — M62830 Muscle spasm of back: Secondary | ICD-10-CM | POA: Diagnosis not present

## 2022-03-03 DIAGNOSIS — M9903 Segmental and somatic dysfunction of lumbar region: Secondary | ICD-10-CM | POA: Diagnosis not present

## 2022-03-03 DIAGNOSIS — G2581 Restless legs syndrome: Secondary | ICD-10-CM | POA: Diagnosis not present

## 2022-03-04 DIAGNOSIS — R634 Abnormal weight loss: Secondary | ICD-10-CM | POA: Diagnosis not present

## 2022-03-04 DIAGNOSIS — E782 Mixed hyperlipidemia: Secondary | ICD-10-CM | POA: Diagnosis not present
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
## 2022-03-14 ENCOUNTER — Encounter: Payer: Self-pay | Admitting: Psychiatry

## 2022-03-14 ENCOUNTER — Other Ambulatory Visit: Payer: Self-pay

## 2022-03-14 ENCOUNTER — Telehealth (INDEPENDENT_AMBULATORY_CARE_PROVIDER_SITE_OTHER): Payer: 59 | Admitting: Psychiatry

## 2022-03-14 DIAGNOSIS — F330 Major depressive disorder, recurrent, mild: Secondary | ICD-10-CM | POA: Diagnosis not present

## 2022-03-14 DIAGNOSIS — G2581 Restless legs syndrome: Secondary | ICD-10-CM | POA: Diagnosis not present

## 2022-03-14 DIAGNOSIS — M5416 Radiculopathy, lumbar region: Secondary | ICD-10-CM | POA: Diagnosis not present

## 2022-03-14 DIAGNOSIS — M9905 Segmental and somatic dysfunction of pelvic region: Secondary | ICD-10-CM | POA: Diagnosis not present

## 2022-03-14 DIAGNOSIS — M9903 Segmental and somatic dysfunction of lumbar region: Secondary | ICD-10-CM | POA: Diagnosis not present

## 2022-03-14 DIAGNOSIS — M62830 Muscle spasm of back: Secondary | ICD-10-CM | POA: Diagnosis not present

## 2022-03-14 MED ORDER — QUETIAPINE FUMARATE 25 MG PO TABS: 25.0000 mg | ORAL_TABLET | Freq: Every day | ORAL | 0 refills | Status: DC

## 2022-03-14 NOTE — Patient Instructions (Signed)
Continue venlafaxine 225 mg daily ?Check ferritin  ?Start quetiapine 25 mg at night  ?Next appointment- 4/10 at 1:20  ?

## 2022-03-18 ENCOUNTER — Other Ambulatory Visit: Payer: Self-pay | Admitting: *Deleted

## 2022-03-18 DIAGNOSIS — F17210 Nicotine dependence, cigarettes, uncomplicated: Secondary | ICD-10-CM

## 2022-03-18 DIAGNOSIS — Z87891 Personal history of nicotine dependence: Secondary | ICD-10-CM

## 2022-03-21 ENCOUNTER — Other Ambulatory Visit: Payer: Self-pay | Admitting: Physician Assistant

## 2022-03-21 DIAGNOSIS — R29898 Other symptoms and signs involving the musculoskeletal system: Secondary | ICD-10-CM | POA: Diagnosis not present

## 2022-03-21 DIAGNOSIS — G459 Transient cerebral ischemic attack, unspecified: Secondary | ICD-10-CM

## 2022-03-21 DIAGNOSIS — G2581 Restless legs syndrome: Secondary | ICD-10-CM | POA: Diagnosis not present

## 2022-03-21 DIAGNOSIS — Z7689 Persons encountering health services in other specified circumstances: Secondary | ICD-10-CM | POA: Diagnosis not present

## 2022-03-21 DIAGNOSIS — G479 Sleep disorder, unspecified: Secondary | ICD-10-CM | POA: Diagnosis not present

## 2022-03-24 ENCOUNTER — Other Ambulatory Visit (HOSPITAL_COMMUNITY): Payer: Self-pay | Admitting: Physician Assistant

## 2022-03-24 ENCOUNTER — Other Ambulatory Visit: Payer: Self-pay | Admitting: Physician Assistant

## 2022-03-24 DIAGNOSIS — M62830 Muscle spasm of back: Secondary | ICD-10-CM | POA: Diagnosis not present

## 2022-03-24 DIAGNOSIS — G2581 Restless legs syndrome: Secondary | ICD-10-CM

## 2022-03-24 DIAGNOSIS — M5416 Radiculopathy, lumbar region: Secondary | ICD-10-CM | POA: Diagnosis not present

## 2022-03-24 DIAGNOSIS — G479 Sleep disorder, unspecified: Secondary | ICD-10-CM

## 2022-03-24 DIAGNOSIS — M9905 Segmental and somatic dysfunction of pelvic region: Secondary | ICD-10-CM | POA: Diagnosis not present

## 2022-03-24 DIAGNOSIS — R29898 Other symptoms and signs involving the musculoskeletal system: Secondary | ICD-10-CM

## 2022-03-24 DIAGNOSIS — G459 Transient cerebral ischemic attack, unspecified: Secondary | ICD-10-CM

## 2022-03-24 DIAGNOSIS — R4189 Other symptoms and signs involving cognitive functions and awareness: Secondary | ICD-10-CM

## 2022-03-24 DIAGNOSIS — M9903 Segmental and somatic dysfunction of lumbar region: Secondary | ICD-10-CM | POA: Diagnosis not present

## 2022-03-24 DIAGNOSIS — M62838 Other muscle spasm: Secondary | ICD-10-CM
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
## 2022-03-31 ENCOUNTER — Ambulatory Visit
Admission: RE | Admit: 2022-03-31 | Discharge: 2022-03-31 | Disposition: A | Discharge status: Home / Self Care | Payer: 59 | Source: Ambulatory Visit | Attending: Acute Care | Admitting: Acute Care

## 2022-03-31 ENCOUNTER — Telehealth: Payer: Self-pay | Admitting: Psychiatry

## 2022-03-31 ENCOUNTER — Telehealth: Payer: 59 | Admitting: Psychiatry

## 2022-03-31 DIAGNOSIS — Z87891 Personal history of nicotine dependence: Secondary | ICD-10-CM

## 2022-03-31 DIAGNOSIS — R69 Illness, unspecified: Secondary | ICD-10-CM | POA: Diagnosis not present

## 2022-03-31 DIAGNOSIS — F17210 Nicotine dependence, cigarettes, uncomplicated: Secondary | ICD-10-CM

## 2022-03-31 NOTE — Telephone Encounter (Signed)
Sent link for video visit through Epic. Patient did not sign in. Called the patient for appointment scheduled today. The patient did not answer the phone. No option to leave a voice message.  

## 2022-04-01 ENCOUNTER — Other Ambulatory Visit: Payer: Self-pay

## 2022-04-01 DIAGNOSIS — F17210 Nicotine dependence, cigarettes, uncomplicated: Secondary | ICD-10-CM

## 2022-04-01 DIAGNOSIS — Z87891 Personal history of nicotine dependence: Secondary | ICD-10-CM

## 2022-04-01 DIAGNOSIS — Z122 Encounter for screening for malignant neoplasm of respiratory organs: Secondary | ICD-10-CM

## 2022-04-02 ENCOUNTER — Other Ambulatory Visit: Payer: Self-pay

## 2022-04-02 ENCOUNTER — Ambulatory Visit
Admission: RE | Admit: 2022-04-02 | Discharge: 2022-04-02 | Disposition: A | Discharge status: Home / Self Care | Payer: 59 | Source: Ambulatory Visit | Attending: Physician Assistant | Admitting: Physician Assistant

## 2022-04-02 DIAGNOSIS — G459 Transient cerebral ischemic attack, unspecified: Secondary | ICD-10-CM | POA: Insufficient documentation

## 2022-04-02 DIAGNOSIS — R29898 Other symptoms and signs involving the musculoskeletal system: Secondary | ICD-10-CM | POA: Diagnosis not present

## 2022-04-02 DIAGNOSIS — I6523 Occlusion and stenosis of bilateral carotid arteries: Secondary | ICD-10-CM | POA: Diagnosis not present

## 2022-04-07 ENCOUNTER — Ambulatory Visit: Payer: 59

## 2022-04-07 DIAGNOSIS — R200 Anesthesia of skin: Secondary | ICD-10-CM | POA: Diagnosis not present

## 2022-04-07 DIAGNOSIS — M62830 Muscle spasm of back: Secondary | ICD-10-CM | POA: Diagnosis not present

## 2022-04-07 DIAGNOSIS — R29898 Other symptoms and signs involving the musculoskeletal system: Secondary | ICD-10-CM | POA: Diagnosis not present

## 2022-04-07 DIAGNOSIS — G4733 Obstructive sleep apnea (adult) (pediatric): Secondary | ICD-10-CM | POA: Diagnosis not present

## 2022-04-07 DIAGNOSIS — M9905 Segmental and somatic dysfunction of pelvic region: Secondary | ICD-10-CM | POA: Diagnosis not present

## 2022-04-07 DIAGNOSIS — M5416 Radiculopathy, lumbar region: Secondary | ICD-10-CM | POA: Diagnosis not present

## 2022-04-07 DIAGNOSIS — M9903 Segmental and somatic dysfunction of lumbar region: Secondary | ICD-10-CM | POA: Diagnosis not present

## 2022-04-10 DIAGNOSIS — G4733 Obstructive sleep apnea (adult) (pediatric): Secondary | ICD-10-CM | POA: Diagnosis not present

## 2022-04-11 ENCOUNTER — Ambulatory Visit
Admission: RE | Admit: 2022-04-11 | Discharge: 2022-04-11 | Disposition: A | Discharge status: Home / Self Care | Payer: 59 | Source: Ambulatory Visit | Attending: Physician Assistant | Admitting: Physician Assistant

## 2022-04-11 DIAGNOSIS — R4189 Other symptoms and signs involving cognitive functions and awareness: Secondary | ICD-10-CM

## 2022-04-11 DIAGNOSIS — R29898 Other symptoms and signs involving the musculoskeletal system: Secondary | ICD-10-CM

## 2022-04-11 DIAGNOSIS — G479 Sleep disorder, unspecified: Secondary | ICD-10-CM

## 2022-04-11 DIAGNOSIS — G2581 Restless legs syndrome: Secondary | ICD-10-CM

## 2022-04-11 DIAGNOSIS — R42 Dizziness and giddiness: Secondary | ICD-10-CM | POA: Diagnosis not present

## 2022-04-11 DIAGNOSIS — M62838 Other muscle spasm: Secondary | ICD-10-CM

## 2022-04-11 DIAGNOSIS — G459 Transient cerebral ischemic attack, unspecified: Secondary | ICD-10-CM

## 2022-04-11 MED ORDER — GADOBENATE DIMEGLUMINE 529 MG/ML IV SOLN
15.0000 mL | Freq: Once | INTRAVENOUS | Status: AC | PRN
  Administered 2022-04-11: 15 mL via INTRAVENOUS

## 2022-04-19 DIAGNOSIS — G4733 Obstructive sleep apnea (adult) (pediatric): Secondary | ICD-10-CM | POA: Diagnosis not present

## 2022-04-21 DIAGNOSIS — M62830 Muscle spasm of back: Secondary | ICD-10-CM | POA: Diagnosis not present

## 2022-04-21 DIAGNOSIS — M5416 Radiculopathy, lumbar region: Secondary | ICD-10-CM | POA: Diagnosis not present

## 2022-04-21 DIAGNOSIS — M9905 Segmental and somatic dysfunction of pelvic region: Secondary | ICD-10-CM | POA: Diagnosis not present

## 2022-04-21 DIAGNOSIS — M9903 Segmental and somatic dysfunction of lumbar region: Secondary | ICD-10-CM | POA: Diagnosis not present

## 2022-05-02 DIAGNOSIS — M9903 Segmental and somatic dysfunction of lumbar region: Secondary | ICD-10-CM | POA: Diagnosis not present

## 2022-05-02 DIAGNOSIS — M62830 Muscle spasm of back: Secondary | ICD-10-CM | POA: Diagnosis not present

## 2022-05-02 DIAGNOSIS — M5416 Radiculopathy, lumbar region: Secondary | ICD-10-CM | POA: Diagnosis not present

## 2022-05-02 DIAGNOSIS — M9905 Segmental and somatic dysfunction of pelvic region: Secondary | ICD-10-CM | POA: Diagnosis not present

## 2022-05-05 DIAGNOSIS — M62830 Muscle spasm of back: Secondary | ICD-10-CM | POA: Diagnosis not present

## 2022-05-05 DIAGNOSIS — M9903 Segmental and somatic dysfunction of lumbar region: Secondary | ICD-10-CM | POA: Diagnosis not present

## 2022-05-05 DIAGNOSIS — M5416 Radiculopathy, lumbar region: Secondary | ICD-10-CM | POA: Diagnosis not present

## 2022-05-05 DIAGNOSIS — M9905 Segmental and somatic dysfunction of pelvic region: Secondary | ICD-10-CM | POA: Diagnosis not present

## 2022-05-07 DIAGNOSIS — G4733 Obstructive sleep apnea (adult) (pediatric): Secondary | ICD-10-CM | POA: Diagnosis not present

## 2022-05-07 DIAGNOSIS — K130 Diseases of lips: Secondary | ICD-10-CM | POA: Diagnosis not present

## 2022-05-07 DIAGNOSIS — M25511 Pain in right shoulder: Secondary | ICD-10-CM | POA: Diagnosis not present

## 2022-05-07 DIAGNOSIS — R21 Rash and other nonspecific skin eruption: Secondary | ICD-10-CM | POA: Diagnosis not present

## 2022-05-12 DIAGNOSIS — E782 Mixed hyperlipidemia: Secondary | ICD-10-CM | POA: Diagnosis not present

## 2022-05-12 DIAGNOSIS — E039 Hypothyroidism, unspecified: Secondary | ICD-10-CM | POA: Diagnosis not present

## 2022-05-12 DIAGNOSIS — Z0000 Encounter for general adult medical examination without abnormal findings: Secondary | ICD-10-CM | POA: Diagnosis not present

## 2022-05-14 ENCOUNTER — Other Ambulatory Visit: Payer: Self-pay | Admitting: Family Medicine

## 2022-05-14 DIAGNOSIS — D1779 Benign lipomatous neoplasm of other sites: Secondary | ICD-10-CM

## 2022-05-15 DIAGNOSIS — M9905 Segmental and somatic dysfunction of pelvic region: Secondary | ICD-10-CM | POA: Diagnosis not present

## 2022-05-15 DIAGNOSIS — M62830 Muscle spasm of back: Secondary | ICD-10-CM | POA: Diagnosis not present

## 2022-05-15 DIAGNOSIS — M9903 Segmental and somatic dysfunction of lumbar region: Secondary | ICD-10-CM | POA: Diagnosis not present

## 2022-05-15 DIAGNOSIS — M5416 Radiculopathy, lumbar region: Secondary | ICD-10-CM | POA: Diagnosis not present

## 2022-05-20 DIAGNOSIS — G2581 Restless legs syndrome: Secondary | ICD-10-CM | POA: Diagnosis not present

## 2022-05-20 DIAGNOSIS — E039 Hypothyroidism, unspecified: Secondary | ICD-10-CM | POA: Diagnosis not present

## 2022-05-20 DIAGNOSIS — R69 Illness, unspecified: Secondary | ICD-10-CM | POA: Diagnosis not present

## 2022-05-20 DIAGNOSIS — E782 Mixed hyperlipidemia: Secondary | ICD-10-CM | POA: Diagnosis not present

## 2022-05-20 DIAGNOSIS — M25511 Pain in right shoulder: Secondary | ICD-10-CM | POA: Diagnosis not present

## 2022-05-20 DIAGNOSIS — Z720 Tobacco use: Secondary | ICD-10-CM | POA: Diagnosis not present

## 2022-05-22 DIAGNOSIS — M5416 Radiculopathy, lumbar region: Secondary | ICD-10-CM | POA: Diagnosis not present

## 2022-05-22 DIAGNOSIS — M9905 Segmental and somatic dysfunction of pelvic region: Secondary | ICD-10-CM | POA: Diagnosis not present

## 2022-05-22 DIAGNOSIS — M9903 Segmental and somatic dysfunction of lumbar region: Secondary | ICD-10-CM | POA: Diagnosis not present

## 2022-05-22 DIAGNOSIS — M62830 Muscle spasm of back: Secondary | ICD-10-CM | POA: Diagnosis not present

## 2022-05-26 DIAGNOSIS — M75101 Unspecified rotator cuff tear or rupture of right shoulder, not specified as traumatic: Secondary | ICD-10-CM | POA: Diagnosis not present

## 2022-05-26 DIAGNOSIS — M25511 Pain in right shoulder: Secondary | ICD-10-CM | POA: Diagnosis not present

## 2022-05-29 DIAGNOSIS — M9903 Segmental and somatic dysfunction of lumbar region: Secondary | ICD-10-CM | POA: Diagnosis not present

## 2022-05-29 DIAGNOSIS — M9905 Segmental and somatic dysfunction of pelvic region: Secondary | ICD-10-CM | POA: Diagnosis not present

## 2022-05-29 DIAGNOSIS — M5416 Radiculopathy, lumbar region: Secondary | ICD-10-CM | POA: Diagnosis not present

## 2022-05-29 DIAGNOSIS — M62830 Muscle spasm of back: Secondary | ICD-10-CM | POA: Diagnosis not present

## 2022-06-05 DIAGNOSIS — M9903 Segmental and somatic dysfunction of lumbar region: Secondary | ICD-10-CM | POA: Diagnosis not present

## 2022-06-05 DIAGNOSIS — M9905 Segmental and somatic dysfunction of pelvic region: Secondary | ICD-10-CM | POA: Diagnosis not present

## 2022-06-05 DIAGNOSIS — M62830 Muscle spasm of back: Secondary | ICD-10-CM | POA: Diagnosis not present

## 2022-06-05 DIAGNOSIS — M5416 Radiculopathy, lumbar region: Secondary | ICD-10-CM | POA: Diagnosis not present

## 2022-06-07 DIAGNOSIS — G4733 Obstructive sleep apnea (adult) (pediatric): Secondary | ICD-10-CM | POA: Diagnosis not present

## 2022-06-13 DIAGNOSIS — M9905 Segmental and somatic dysfunction of pelvic region: Secondary | ICD-10-CM | POA: Diagnosis not present

## 2022-06-13 DIAGNOSIS — M62830 Muscle spasm of back: Secondary | ICD-10-CM | POA: Diagnosis not present

## 2022-06-13 DIAGNOSIS — M5416 Radiculopathy, lumbar region: Secondary | ICD-10-CM | POA: Diagnosis not present

## 2022-06-13 DIAGNOSIS — M9903 Segmental and somatic dysfunction of lumbar region: Secondary | ICD-10-CM | POA: Diagnosis not present

## 2022-06-18 DIAGNOSIS — M9903 Segmental and somatic dysfunction of lumbar region: Secondary | ICD-10-CM | POA: Diagnosis not present

## 2022-06-18 DIAGNOSIS — M62830 Muscle spasm of back: Secondary | ICD-10-CM | POA: Diagnosis not present

## 2022-06-18 DIAGNOSIS — M5416 Radiculopathy, lumbar region: Secondary | ICD-10-CM | POA: Diagnosis not present

## 2022-06-18 DIAGNOSIS — M9905 Segmental and somatic dysfunction of pelvic region: Secondary | ICD-10-CM | POA: Diagnosis not present

## 2022-06-25 DIAGNOSIS — M62830 Muscle spasm of back: Secondary | ICD-10-CM | POA: Diagnosis not present

## 2022-06-25 DIAGNOSIS — M5416 Radiculopathy, lumbar region: Secondary | ICD-10-CM | POA: Diagnosis not present

## 2022-06-25 DIAGNOSIS — M9903 Segmental and somatic dysfunction of lumbar region: Secondary | ICD-10-CM | POA: Diagnosis not present

## 2022-06-25 DIAGNOSIS — M9905 Segmental and somatic dysfunction of pelvic region: Secondary | ICD-10-CM | POA: Diagnosis not present

## 2022-06-30 DIAGNOSIS — M75101 Unspecified rotator cuff tear or rupture of right shoulder, not specified as traumatic: Secondary | ICD-10-CM | POA: Diagnosis not present

## 2022-06-30 DIAGNOSIS — M25532 Pain in left wrist: Secondary | ICD-10-CM | POA: Diagnosis not present

## 2022-07-01 ENCOUNTER — Other Ambulatory Visit: Payer: Self-pay | Admitting: Orthopedic Surgery

## 2022-07-01 DIAGNOSIS — M75101 Unspecified rotator cuff tear or rupture of right shoulder, not specified as traumatic: Secondary | ICD-10-CM

## 2022-07-02 ENCOUNTER — Telehealth: Payer: Self-pay | Admitting: Psychiatry

## 2022-07-02 DIAGNOSIS — M62830 Muscle spasm of back: Secondary | ICD-10-CM | POA: Diagnosis not present

## 2022-07-02 DIAGNOSIS — M9903 Segmental and somatic dysfunction of lumbar region: Secondary | ICD-10-CM | POA: Diagnosis not present

## 2022-07-02 DIAGNOSIS — M9905 Segmental and somatic dysfunction of pelvic region: Secondary | ICD-10-CM | POA: Diagnosis not present

## 2022-07-02 DIAGNOSIS — M5416 Radiculopathy, lumbar region: Secondary | ICD-10-CM | POA: Diagnosis not present

## 2022-07-02 NOTE — Telephone Encounter (Signed)
Ordered refill of venlafaxine per request. Please contact the patient to make follow up appointment for 30 mins, either video/in person. I will not be able to prescribe any more refills without evaluation.

## 2022-07-02 NOTE — Telephone Encounter (Signed)
Message left for patient to call and schedule a follow up appointment.

## 2022-07-04 DIAGNOSIS — M62830 Muscle spasm of back: Secondary | ICD-10-CM | POA: Diagnosis not present

## 2022-07-04 DIAGNOSIS — M5416 Radiculopathy, lumbar region: Secondary | ICD-10-CM | POA: Diagnosis not present

## 2022-07-04 DIAGNOSIS — M9903 Segmental and somatic dysfunction of lumbar region: Secondary | ICD-10-CM | POA: Diagnosis not present

## 2022-07-04 DIAGNOSIS — M9905 Segmental and somatic dysfunction of pelvic region: Secondary | ICD-10-CM | POA: Diagnosis not present

## 2022-07-07 ENCOUNTER — Ambulatory Visit
Admission: RE | Admit: 2022-07-07 | Discharge: 2022-07-07 | Disposition: A | Discharge status: Home / Self Care | Payer: 59 | Source: Ambulatory Visit | Attending: Orthopedic Surgery | Admitting: Orthopedic Surgery

## 2022-07-07 DIAGNOSIS — M75101 Unspecified rotator cuff tear or rupture of right shoulder, not specified as traumatic: Secondary | ICD-10-CM

## 2022-07-07 DIAGNOSIS — G4733 Obstructive sleep apnea (adult) (pediatric): Secondary | ICD-10-CM | POA: Diagnosis not present

## 2022-07-07 DIAGNOSIS — M25511 Pain in right shoulder: Secondary | ICD-10-CM | POA: Diagnosis not present

## 2022-07-08 DIAGNOSIS — M62830 Muscle spasm of back: Secondary | ICD-10-CM | POA: Diagnosis not present

## 2022-07-08 DIAGNOSIS — M9905 Segmental and somatic dysfunction of pelvic region: Secondary | ICD-10-CM | POA: Diagnosis not present

## 2022-07-08 DIAGNOSIS — M5416 Radiculopathy, lumbar region: Secondary | ICD-10-CM | POA: Diagnosis not present

## 2022-07-08 DIAGNOSIS — M9903 Segmental and somatic dysfunction of lumbar region: Secondary | ICD-10-CM | POA: Diagnosis not present

## 2022-07-11 DIAGNOSIS — M5416 Radiculopathy, lumbar region: Secondary | ICD-10-CM | POA: Diagnosis not present

## 2022-07-11 DIAGNOSIS — M9905 Segmental and somatic dysfunction of pelvic region: Secondary | ICD-10-CM | POA: Diagnosis not present

## 2022-07-11 DIAGNOSIS — M62830 Muscle spasm of back: Secondary | ICD-10-CM | POA: Diagnosis not present

## 2022-07-11 DIAGNOSIS — M9903 Segmental and somatic dysfunction of lumbar region: Secondary | ICD-10-CM | POA: Diagnosis not present

## 2022-07-15 DIAGNOSIS — R1084 Generalized abdominal pain: Secondary | ICD-10-CM | POA: Diagnosis not present

## 2022-07-15 DIAGNOSIS — R1031 Right lower quadrant pain: Secondary | ICD-10-CM | POA: Diagnosis not present

## 2022-07-23 DIAGNOSIS — M7521 Bicipital tendinitis, right shoulder: Secondary | ICD-10-CM | POA: Diagnosis not present

## 2022-07-23 DIAGNOSIS — M7551 Bursitis of right shoulder: Secondary | ICD-10-CM | POA: Diagnosis not present

## 2022-07-23 DIAGNOSIS — M75101 Unspecified rotator cuff tear or rupture of right shoulder, not specified as traumatic: Secondary | ICD-10-CM | POA: Diagnosis not present

## 2022-07-25 DIAGNOSIS — L98499 Non-pressure chronic ulcer of skin of other sites with unspecified severity: Secondary | ICD-10-CM | POA: Diagnosis not present

## 2022-07-25 DIAGNOSIS — L821 Other seborrheic keratosis: Secondary | ICD-10-CM | POA: Diagnosis not present

## 2022-07-25 DIAGNOSIS — L570 Actinic keratosis: Secondary | ICD-10-CM | POA: Diagnosis not present

## 2022-07-25 DIAGNOSIS — D485 Neoplasm of uncertain behavior of skin: Secondary | ICD-10-CM | POA: Diagnosis not present

## 2022-08-07 DIAGNOSIS — G4733 Obstructive sleep apnea (adult) (pediatric): Secondary | ICD-10-CM | POA: Diagnosis not present

## 2022-08-11 DIAGNOSIS — G4733 Obstructive sleep apnea (adult) (pediatric): Secondary | ICD-10-CM | POA: Diagnosis not present

## 2022-08-13 DIAGNOSIS — M62830 Muscle spasm of back: Secondary | ICD-10-CM | POA: Diagnosis not present

## 2022-08-13 DIAGNOSIS — M9903 Segmental and somatic dysfunction of lumbar region: Secondary | ICD-10-CM | POA: Diagnosis not present

## 2022-08-13 DIAGNOSIS — M9905 Segmental and somatic dysfunction of pelvic region: Secondary | ICD-10-CM | POA: Diagnosis not present

## 2022-08-13 DIAGNOSIS — M5416 Radiculopathy, lumbar region: Secondary | ICD-10-CM | POA: Diagnosis not present

## 2022-08-15 DIAGNOSIS — M62830 Muscle spasm of back: Secondary | ICD-10-CM | POA: Diagnosis not present

## 2022-08-15 DIAGNOSIS — M9903 Segmental and somatic dysfunction of lumbar region: Secondary | ICD-10-CM | POA: Diagnosis not present

## 2022-08-15 DIAGNOSIS — M5416 Radiculopathy, lumbar region: Secondary | ICD-10-CM | POA: Diagnosis not present

## 2022-08-15 DIAGNOSIS — M9905 Segmental and somatic dysfunction of pelvic region: Secondary | ICD-10-CM | POA: Diagnosis not present

## 2022-08-19 DIAGNOSIS — M9903 Segmental and somatic dysfunction of lumbar region: Secondary | ICD-10-CM | POA: Diagnosis not present

## 2022-08-19 DIAGNOSIS — M5416 Radiculopathy, lumbar region: Secondary | ICD-10-CM | POA: Diagnosis not present

## 2022-08-19 DIAGNOSIS — M9905 Segmental and somatic dysfunction of pelvic region: Secondary | ICD-10-CM | POA: Diagnosis not present

## 2022-08-19 DIAGNOSIS — M62830 Muscle spasm of back: Secondary | ICD-10-CM | POA: Diagnosis not present

## 2022-08-21 DIAGNOSIS — M5417 Radiculopathy, lumbosacral region: Secondary | ICD-10-CM | POA: Diagnosis not present

## 2022-08-21 DIAGNOSIS — E782 Mixed hyperlipidemia: Secondary | ICD-10-CM | POA: Diagnosis not present

## 2022-08-29 ENCOUNTER — Other Ambulatory Visit: Payer: Self-pay | Admitting: Psychiatry

## 2022-09-05 ENCOUNTER — Telehealth: Payer: Self-pay

## 2022-09-05 NOTE — Patient Outreach (Signed)
  Care Coordination   09/05/2022 Name: Kara Russo MRN: [id number] DOB: 04-18-1966   Care Coordination Outreach Attempts:  An unsuccessful telephone outreach was attempted today to offer the patient information about available care coordination services as a benefit of their health plan.   Follow Up Plan:  Additional outreach attempts will be made to offer the patient care coordination information and services.   Encounter Outcome:  No Answer  Care Coordination Interventions Activated:  No   Care Coordination Interventions:  No, not indicated    Noreene Larsson RN, MSN, CCM Community Care Coordinator [facility] Mobile: [phone]

## 2022-09-07 DIAGNOSIS — G4733 Obstructive sleep apnea (adult) (pediatric): Secondary | ICD-10-CM | POA: Diagnosis not present

## 2022-09-18 ENCOUNTER — Ambulatory Visit: Payer: Self-pay

## 2022-09-18 ENCOUNTER — Other Ambulatory Visit: Payer: Self-pay | Admitting: Psychiatry

## 2022-09-18 ENCOUNTER — Telehealth: Payer: Self-pay | Admitting: Psychiatry

## 2022-09-18 MED ORDER — VENLAFAXINE HCL ER 75 MG PO CP24: 225.0000 mg | ORAL_CAPSULE | Freq: Every day | ORAL | 1 refills | Status: DC

## 2022-09-18 NOTE — Patient Outreach (Signed)
Care Coordination   Initial Visit Note   09/18/2022 Name: Kara Russo MRN: [id number] DOB: 07-Oct-1966  Kara Russo is a 56 y.o. year old female who sees Leonel Ramsay, MD for primary care. I spoke with  Kara Russo by phone today.  What matters to the patients health and wellness today?  RLS, stressors, anxiety, depression, and other concerns in maintaining her health and well being    Goals Addressed             This Visit's Progress    RNCM: Effective Management of depression and anxiety       Care Coordination Interventions: Evaluation of current treatment plan related to anxiety, depression, constipation, RLS, and health and wellness care and patient's adherence to plan as established by provider. 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 The patient had a bad experience as a child at the dentist and went this week to the dentist and had a bad experience with the provider. The patient has a lot of anxiety but is willing to work with the care coordination team to help balance her health and wellbeing. Emotional support and education given.  Advised patient to call the office for changes in her chronic conditions, questions, or concerns. The patient has a lot of stress in her life that impact her care. She has seen dentist recently and needs additional dental care. She has a call in to psychiatrist as she is out of her medications for depression. Encouraged her to follow up with provider to get refills of medications Provided education to patient re: healthy diet and healthy habits to promote rest and relaxation, the patient deals with a lot of spasms in her legs at night. Review of sx and sx and recommendations on ways to help with improving her sx and sx. Will attach information in Mychart, by email and the EMMI system to help with educational needs.  Reviewed medications  with patient and discussed compliance. The patient does not feel the gabapentin helps with her RLS, she is out of her medications for depression, has a call into the provider. Education on [employer] Leg Cramps dissolvable tablets to try as an over the counter product to help with leg spasms. They have a pm formula and a daytime formula. Education and support given Collaborated with LCSW  regarding patients expressed needs and the desire to work with the LCSW for support and education Provided patient with healthy diet choices, mindfulness, self care, and dealing with care giver stressors,  educational materials related to patients complex care needs Reviewed scheduled/upcoming provider appointments including 09-23-2022 with LCSW and 10-14-2022 at 0945 am with the [facility] referral for stressors, anxiety, and depression with ongoing support and education Discussed plans with patient for ongoing care management follow up and provided patient with direct contact information for care management team Advised patient to discuss changes in her chronic conditions with provider Screening for signs and symptoms of depression related to chronic disease state  Assessed social determinant of health barriers The patient agrees to regular outreaches from the [facility] and [facility]. Review of the goals of the program and how to reach the [facility]. The patient knows she can view notes in myChart. Will continue to monitor for changes or educational needs.   Active listening / Reflection utilized  Emotional Support Provided  SDOH assessments and interventions completed:  Yes  SDOH Interventions Today    Flowsheet Row Most Recent Value  SDOH Interventions   Food Insecurity Interventions Intervention Not Indicated  Housing Interventions Intervention Not Indicated  Transportation Interventions Intervention Not Indicated  Utilities Interventions Intervention Not Indicated  Financial Strain Interventions  Intervention Not Indicated  Stress Interventions Other (Comment)  [appointment scheduled with the LCSW]        Care Coordination Interventions Activated:  Yes  Care Coordination Interventions:  Yes, provided   Follow up plan: Follow up call scheduled for 10-14-2022 at 0945 am    Encounter Outcome:  Pt. Visit Completed   Noreene Larsson RN, MSN, [facility]  Mobile: [phone]

## 2022-09-18 NOTE — Patient Outreach (Signed)
  Care Coordination   09/18/2022 Name: Kara Russo MRN: [id number] DOB: 01/08/1966   Care Coordination Outreach Attempts:  A second unsuccessful outreach was attempted today to offer the patient with information about available care coordination services as a benefit of their health plan.     Follow Up Plan:  Additional outreach attempts will be made to offer the patient care coordination information and services.   Encounter Outcome:  No Answer  Care Coordination Interventions Activated:  No   Care Coordination Interventions:  No, not indicated    Noreene Larsson RN, MSN, [facility]  Mobile: [phone]

## 2022-09-18 NOTE — Patient Instructions (Signed)
Visit Information  Thank you for taking time to visit with me today. Please don't hesitate to contact me if I can be of assistance to you.   Following are the goals we discussed today:   Goals Addressed             This Visit's Progress    RNCM: Effective Management of depression and anxiety       Care Coordination Interventions: Evaluation of current treatment plan related to anxiety, depression, constipation, RLS, and health and wellness care and patient's adherence to plan as established by provider. 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 The patient had a bad experience as a child at the dentist and went this week to the dentist and had a bad experience with the provider. The patient has a lot of anxiety but is willing to work with the care coordination team to help balance her health and wellbeing. Emotional support and education given.  Advised patient to call the office for changes in her chronic conditions, questions, or concerns. The patient has a lot of stress in her life that impact her care. She has seen dentist recently and needs additional dental care. She has a call in to psychiatrist as she is out of her medications for depression. Encouraged her to follow up with provider to get refills of medications Provided education to patient re: healthy diet and healthy habits to promote rest and relaxation, the patient deals with a lot of spasms in her legs at night. Review of sx and sx and recommendations on ways to help with improving her sx and sx. Will attach information in Mychart, by email and the EMMI system to help with educational needs.  Reviewed medications with patient and discussed compliance. The patient does not feel the gabapentin helps with her RLS, she is out of her medications for depression, has a call into the provider. Education on [employer] Leg Cramps dissolvable  tablets to try as an over the counter product to help with leg spasms. They have a pm formula and a daytime formula. Education and support given Collaborated with LCSW  regarding patients expressed needs and the desire to work with the LCSW for support and education Provided patient with healthy diet choices, mindfulness, self care, and dealing with care giver stressors,  educational materials related to patients complex care needs Reviewed scheduled/upcoming provider appointments including 09-23-2022 with LCSW and 10-14-2022 at 0945 am with the [facility] referral for stressors, anxiety, and depression with ongoing support and education Discussed plans with patient for ongoing care management follow up and provided patient with direct contact information for care management team Advised patient to discuss changes in her chronic conditions with provider Screening for signs and symptoms of depression related to chronic disease state  Assessed social determinant of health barriers The patient agrees to regular outreaches from the [facility] and [place]. Review of the goals of the program and how to reach the [facility]. The patient knows she can view notes in myChart. Will continue to monitor for changes or educational needs.   Active listening / Reflection utilized  Emotional Support Provided         Our next appointment is by telephone on 10-14-2022 at 0945 am  Please call the care guide team at [phone] if you need to cancel or reschedule your appointment.   If you are experiencing a Mental  Health or [place] or need someone to talk to, please call the Suicide and Crisis Lifeline: 988 call the [place] National Suicide Prevention Lifeline: 773-750-0508 or TTY: 940-198-2620 TTY 3152652365) to talk to a trained counselor call 1-800-273-TALK (toll free, 24 hour hotline)  Patient verbalizes understanding of instructions and care plan provided today and agrees to view in [place].  Active MyChart status and patient understanding of how to access instructions and care plan via MyChart confirmed with patient.     Telephone follow up appointment with care management team member scheduled for: 10-14-2022 at [place] am  Noreene Larsson RN, MSN, [place]  Mobile: [phone]    Preventive Dental Care, Adult Preventive dental care is any dental-related procedure or treatment that can prevent dental or other health problems in the future. Preventive dental care begins at birth and continues for a lifetime. This care includes seeing a dental care provider regularly and practicing good dental care (oral hygiene) at home. These actions can help to prevent cavities, root canal problems, gum disease (gingivitis), tooth loss, and other tooth problems. Regular dental exams may also help your health care provider diagnose other medical problems. Many diseases, including mouth cancers, have early signs that can be found during a preventive dental care visit. Schedule an appointment to see a dental care provider at least one time each year for preventive dental care. What can I expect for my preventive dental care visit? Counseling At your visit, your dental care provider may ask you about: Your brushing and flossing habits. Your overall health and diet. Any new symptoms, such as: Bleeding gums. Mouth, tooth, or jaw pain. Dull headache. Using a mouthguard for sports or because of teeth clenching or grinding. The need or desire to get braces to straighten teeth (orthodontic care). Physical exam Your dental care provider will do an oral (mouth) exam to check for: Jaw or other tooth problems. Gum disease or tooth decay. Signs of teeth grinding. Discolored teeth or enamel erosion. Abnormal jaw movement or pain in the jaw joint. Neck swelling or lumps. Signs of cancer. Other services You may also have: Dental X-rays. Cavities filled. Your teeth  cleaned. Follow these instructions at home: Oral health     Make sure you brush your teeth with an appropriate-sized, soft-bristled toothbrush with an approved fluoride toothpaste every morning and night. Toothbrushes should be replaced every 3-4 months and if the bristles become frayed. Ask your dental care provider for toothpaste recommendations. Floss at least once every day. Check your teeth for white or brown spots after brushing. These may be signs of cavities. Check your gums for swelling or bleeding. These may be signs of gum disease. Take over-the-counter and prescription medicines only as told by your dental care provider. Eating and drinking Eat a diet that includes plenty of fruits, vegetables, milk and dairy products, whole grains, and proteins. Do not eat a lot of starchy foods or foods with added sugar. Talk with your health care provider if you have questions about following a healthy diet. Avoid sodas, sugary snacks, and sticky candies. Choose water or milk instead of fruit juice, sodas, or sports drinks. General instructions Do not use any products that contain nicotine or tobacco. These products include cigarettes, chewing tobacco, and vaping devices, such as e-cigarettes. If you need help quitting, ask your health care provider. Do not get mouth piercings. Always wear a mouthguard when playing contact or collision sports. For more information: American Dental Association: www.mouthhealthy.org Contact  your dental care provider if you have: Gum, tooth, or jaw pain. Red, swollen, or bleeding gums. A tooth or teeth that are very sensitive to hot or cold. Very bad breath or a dry mouth. A problem with a filling, crown, implant, or denture. A broken or loose tooth. A growth or sore in your mouth that is not going away. What's next? Preventive dental care can help to prevent cavities, root canal problems, gum disease (gingivitis), tooth loss, and other tooth  problems. Schedule an appointment to see a dental care provider at least one time each year for preventive dental care. This information is not intended to replace advice given to you by your health care provider. Make sure you discuss any questions you have with your health care provider. Document Revised: 02/19/2022 Document Reviewed: 08/11/2021 Elsevier Patient Education  [place].  Constipation, Adult Constipation is when a person has trouble pooping (having a bowel movement). When you have this condition, you may poop fewer than 3 times a week. Your poop (stool) may also be dry, hard, or bigger than normal. Follow these instructions at home: Eating and drinking  Eat foods that have a lot of fiber, such as: Fresh fruits and vegetables. Whole grains. Beans. Eat less of foods that are low in fiber and high in fat and sugar, such as: [place] fries. Hamburgers. Cookies. Candy. Soda. Drink enough fluid to keep your pee (urine) pale yellow. General instructions Exercise regularly or as told by your doctor. Try to do 150 minutes of exercise each week. Go to the restroom when you feel like you need to poop. Do not hold it in. Take over-the-counter and prescription medicines only as told by your doctor. These include any fiber supplements. When you poop: Do deep breathing while relaxing your lower belly (abdomen). Relax your pelvic floor. The pelvic floor is a group of muscles that support the rectum, bladder, and intestines (as well as the uterus in women). Watch your condition for any changes. Tell your doctor if you notice any. Keep all follow-up visits as told by your doctor. This is important. Contact a doctor if: You have pain that gets worse. You have a fever. You have not pooped for 4 days. You vomit. You are not hungry. You lose weight. You are bleeding from the opening of the butt (anus). You have thin, pencil-like poop. Get help right away if: You have a fever,  and your symptoms suddenly get worse. You leak poop or have blood in your poop. Your belly feels hard or bigger than normal (bloated). You have very bad belly pain. You feel dizzy or you faint. Summary Constipation is when a person poops fewer than 3 times a week, has trouble pooping, or has poop that is dry, hard, or bigger than normal. Eat foods that have a lot of fiber. Drink enough fluid to keep your pee (urine) pale yellow. Take over-the-counter and prescription medicines only as told by your doctor. These include any fiber supplements. This information is not intended to replace advice given to you by your health care provider. Make sure you discuss any questions you have with your health care provider. Document Revised: 10/26/2019 Document Reviewed: 10/26/2019 Elsevier Patient Education  [place].     Restless Legs Syndrome Restless legs syndrome is a condition that causes uncomfortable feelings or sensations in the legs, especially while sitting or lying down. The sensations usually cause an overwhelming urge to move the legs. The arms can also sometimes be  affected. The condition can range from mild to severe. The symptoms often interfere with a person's ability to sleep. What are the causes? The cause of this condition is not known. What increases the risk? The following factors may make you more likely to develop this condition: Being older than 50. Pregnancy. Being a woman. In general, the condition is more common in women than in men. A family history of the condition. Having iron deficiency. Overuse of caffeine, nicotine, or alcohol. Certain medical conditions, such as kidney disease, Parkinson's disease, or nerve damage. Certain medicines, such as those for high blood pressure, nausea, colds, allergies, depression, and some heart conditions. What are the signs or symptoms? The main symptom of this condition is uncomfortable sensations in the legs, such  as: Pulling. Tingling. Prickling. Throbbing. Crawling. Burning. Usually, the sensations: Affect both sides of the body. Are worse when you sit or lie down. Are worse at night. These may make it difficult to fall asleep. Make you have a strong urge to move your legs. Are temporarily relieved by moving your legs or standing. The arms can also be affected, but this is rare. People who have this condition often have tiredness during the day because of their lack of sleep at night. How is this diagnosed? This condition may be diagnosed based on: Your symptoms. Blood tests. In some cases, you may be monitored in a sleep lab by a specialist (a sleep study). This can detect any disruptions in your sleep. How is this treated? This condition is treated by managing the symptoms. This may include: Lifestyle changes, such as exercising, using relaxation techniques, and avoiding caffeine, alcohol, or tobacco. Iron supplements. Medicines. Parkinson's medications may be tried first. Anti-seizure medications can also be helpful. Follow these instructions at home: General instructions Take over-the-counter and prescription medicines only as told by your health care provider. Use methods to help relieve the uncomfortable sensations, such as: Massaging your legs. Walking or stretching. Taking a cold or hot bath. Keep all follow-up visits. This is important. Lifestyle     Practice good sleep habits. For example, go to bed and get up at the same time every day. Most adults should get 7-9 hours of sleep each night. Exercise regularly. Try to get at least 30 minutes of exercise most days of the week. Practice ways of relaxing, such as yoga or meditation. Avoid caffeine and alcohol. Do not use any products that contain nicotine or tobacco. These products include cigarettes, chewing tobacco, and vaping devices, such as e-cigarettes. If you need help quitting, ask your health care provider. Where to  find more information [employer] of Neurological Disorders and Stroke: MasterBoxes.it Contact a health care provider if: Your symptoms get worse or they do not improve with treatment. Summary Restless legs syndrome is a condition that causes uncomfortable feelings or sensations in the legs, especially while sitting or lying down. The symptoms often interfere with your ability to sleep. This condition is treated by managing the symptoms. You may need to make lifestyle changes or take medicines. This information is not intended to replace advice given to you by your health care provider. Make sure you discuss any questions you have with your health care provider. Document Revised: 07/21/2021 Document Reviewed: 07/21/2021 Elsevier Patient Education  [place].     Generalized Anxiety Disorder, Adult Generalized anxiety disorder (GAD) is a mental health condition. Unlike normal worries, anxiety related to GAD is not triggered by a specific event. These worries do not fade  or get better with time. GAD interferes with relationships, work, and school. GAD symptoms can vary from mild to severe. People with severe GAD can have intense waves of anxiety with physical symptoms that are similar to panic attacks. What are the causes? The exact cause of GAD is not known, but the following are believed to have an impact: Differences in natural brain chemicals. Genes passed down from parents to children. Differences in the way threats are perceived. Development and stress during childhood. Personality. What increases the risk? The following factors may make you more likely to develop this condition: Being female. Having a family history of anxiety disorders. Being very shy. Experiencing very stressful life events, such as the death of a loved one. Having a very stressful family environment. What are the signs or symptoms? People with GAD often worry excessively about many things  in their lives, such as their health and family. Symptoms may also include: Mental and emotional symptoms: Worrying excessively about natural disasters. Fear of being late. Difficulty concentrating. Fears that others are judging your performance. Physical symptoms: Fatigue. Headaches, muscle tension, muscle twitches, trembling, or feeling shaky. Feeling like your heart is pounding or beating very fast. Feeling out of breath or like you cannot take a deep breath. Having trouble falling asleep or staying asleep, or experiencing restlessness. Sweating. Nausea, diarrhea, or irritable bowel syndrome (IBS). Behavioral symptoms: Experiencing erratic moods or irritability. Avoidance of new situations. Avoidance of people. Extreme difficulty making decisions. How is this diagnosed? This condition is diagnosed based on your symptoms and medical history. You will also have a physical exam. Your health care provider may perform tests to rule out other possible causes of your symptoms. To be diagnosed with GAD, a person must have anxiety that: Is out of his or her control. Affects several different aspects of his or her life, such as work and relationships. Causes distress that makes him or her unable to take part in normal activities. Includes at least three symptoms of GAD, such as restlessness, fatigue, trouble concentrating, irritability, muscle tension, or sleep problems. Before your health care provider can confirm a diagnosis of GAD, these symptoms must be present more days than they are not, and they must last for 6 months or longer. How is this treated? This condition may be treated with: Medicine. Antidepressant medicine is usually prescribed for long-term daily control. Anti-anxiety medicines may be added in severe cases, especially when panic attacks occur. Talk therapy (psychotherapy). Certain types of talk therapy can be helpful in treating GAD by providing support, education, and  guidance. Options include: Cognitive behavioral therapy (CBT). People learn coping skills and self-calming techniques to ease their physical symptoms. They learn to identify unrealistic thoughts and behaviors and to replace them with more appropriate thoughts and behaviors. Acceptance and commitment therapy (ACT). This treatment teaches people how to be mindful as a way to cope with unwanted thoughts and feelings. Biofeedback. This process trains you to manage your body's response (physiological response) through breathing techniques and relaxation methods. You will work with a therapist while machines are used to monitor your physical symptoms. Stress management techniques. These include yoga, meditation, and exercise. A mental health specialist can help determine which treatment is best for you. Some people see improvement with one type of therapy. However, other people require a combination of therapies. Follow these instructions at home: Lifestyle Maintain a consistent routine and schedule. Anticipate stressful situations. Create a plan and allow extra time to work with your plan. Practice  stress management or self-calming techniques that you have learned from your therapist or your health care provider. Exercise regularly and spend time outdoors. Eat a healthy diet that includes plenty of vegetables, fruits, whole grains, low-fat dairy products, and lean protein. Do not eat a lot of foods that are high in fat, added sugar, or salt (sodium). Drink plenty of water. Avoid alcohol. Alcohol can increase anxiety. Avoid caffeine and certain over-the-counter cold medicines. These may make you feel worse. Ask your pharmacist which medicines to avoid. General instructions Take over-the-counter and prescription medicines only as told by your health care provider. Understand that you are likely to have setbacks. Accept this and be kind to yourself as you persist to take better care of  yourself. Anticipate stressful situations. Create a plan and allow extra time to work with your plan. Recognize and accept your accomplishments, even if you judge them as small. Spend time with people who care about you. Keep all follow-up visits. This is important. Where to find more information [place]: https://carter.com/ Substance Abuse and Mental Health Services: ktimeonline.com Contact a health care provider if: Your symptoms do not get better. Your symptoms get worse. You have signs of depression, such as: A persistently sad or irritable mood. Loss of enjoyment in activities that used to bring you joy. Change in weight or eating. Changes in sleeping habits. Get help right away if: You have thoughts about hurting yourself or others. If you ever feel like you may hurt yourself or others, or have thoughts about taking your own life, get help right away. Go to your nearest emergency department or: Call your local emergency services (911 in the [place] Call a suicide crisis helpline, such as the [place] at (616) 601-3208 or 988 in the [place]. This is open 24 hours a day in the [place] Text the Crisis Text Line at 712-485-4878 (in the [place].). Summary Generalized anxiety disorder (GAD) is a mental health condition that involves worry that is not triggered by a specific event. People with GAD often worry excessively about many things in their lives, such as their health and family. GAD may cause symptoms such as restlessness, trouble concentrating, sleep problems, frequent sweating, nausea, diarrhea, headaches, and trembling or muscle twitching. A mental health specialist can help determine which treatment is best for you. Some people see improvement with one type of therapy. However, other people require a combination of therapies. This information is not intended to replace advice given to you by your health care provider. Make sure you discuss any  questions you have with your health care provider. Document Revised: 07/03/2021 Document Reviewed: 03/31/2021 Elsevier Patient Education  [place] will learn about mood disorders, how these conditions can occur in people with heart disease, and why it is important to treat these conditions. To view the content, go to this web address: https://pe.[email or web address].[email or web address]  This video will expire on: 06/13/2024. If you need access to this video following this date, please reach out to the healthcare provider who assigned it to you. This information is not intended to replace advice given to you by your health care provider. Make sure you discuss any questions you have with your health care provider. Elsevier Patient Education  [place].

## 2022-09-18 NOTE — Telephone Encounter (Signed)
pt left a message that she needs refills on the venlafaxine xr . pt last seen on 02-22-22 next appt 10-10    Outpatient Medication Detail   Disp Refills Start End   venlafaxine XR (EFFEXOR-XR) 75 MG 24 hr capsule 90 capsule 0 07/02/2022 08/01/2022   Sig - Route: Take 3 capsules (225 mg total) by mouth daily with breakfast. - Oral   Sent to pharmacy as: venlafaxine XR (EFFEXOR-XR) 75 MG 24 hr capsule   E-Prescribing Status: Receipt confirmed by pharmacy (07/02/2022  3:49 PM EDT)

## 2022-09-18 NOTE — Telephone Encounter (Signed)
Patient called to schedule her appointment she missed from April. She is scheduled for 09-30-22. States she needs a refill on the Effexor 75 mg. Please advise

## 2022-09-18 NOTE — Telephone Encounter (Signed)
Ordered

## 2022-09-22 NOTE — Telephone Encounter (Signed)
Pt was left a voicemail message that rx was sent.

## 2022-09-23 ENCOUNTER — Ambulatory Visit: Payer: Self-pay | Admitting: *Deleted

## 2022-09-23 NOTE — Patient Instructions (Signed)
Visit Information  Thank you for taking time to visit with me today. Please don't hesitate to contact me if I can be of assistance to you.   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 Patient states that her mother has sight limitations-resources provided for [facility] [phone] and [place] [phone] Patient confirms that at times, her caregiver responsibilities can be overwhelming and stressful-patient confirms feeling much better Patient discussed having a history of depression and is currently receiving mental health treatment through [place] and [place] Patient further confirms having having a caregiver for her family member when she goes to work  PHQ2/PHQ9 completed Solution-Focused Strategies employed:  Active listening / Reflection utilized  Emotional Support Provided Discussed self-care action plan to manage caregiver stress: self care emphasized, engagement in activities that bring her peace reiterated, follow up with resources provided reinforced         Our next appointment is by telephone on 10/07/22 at 11am  Please call the care guide team at [phone] if you need to cancel or reschedule your appointment.   If you are experiencing a Mental Health or [place] or need someone to talk to, please call the Suicide and Crisis Lifeline: 988 call 911   Patient verbalizes understanding of instructions and care plan provided today and agrees to view in [place]. Active MyChart status and patient understanding of how to access instructions and care plan via MyChart confirmed with patient.     Telephone follow up appointment with care management team member scheduled for: 10/07/22  Elliot Gurney, [place] Worker  [facility] Care Management [phone]

## 2022-09-23 NOTE — Patient Outreach (Signed)
  Care Coordination   Initial Visit Note   09/23/2022 Name: Kara Russo MRN: [id number] DOB: 01/02/1966  Kara Russo is a 56 y.o. year old female who sees Leonel Ramsay, MD for primary care. I spoke with  Kara Russo by phone today.  What matters to the patients health and wellness today?  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 Patient states that her mother has sight limitations-resources provided for [facility] [phone] and [place] [phone] Patient confirms that at times, her caregiver responsibilities can be overwhelming and stressful-patient confirms feeling much better Patient discussed having a history of depression and is currently receiving mental health treatment through [place] and [place] Patient further confirms having having a caregiver for her family member when she goes to work  PHQ2/PHQ9 completed Solution-Focused Strategies employed:  Active listening / Reflection utilized  Emotional Support Provided Discussed self-care action plan to manage caregiver stress: self care emphasized, engagement in activities that bring her peace reiterated, follow up with resources provided reinforced         SDOH assessments and interventions completed:  Yes  SDOH Interventions Today    Flowsheet Row Most Recent Value  SDOH Interventions   Food Insecurity Interventions Intervention Not Indicated  Housing Interventions Intervention Not Indicated  Transportation Interventions Intervention Not Indicated  Stress Interventions --  [patient currenlty in treatment]        Care Coordination Interventions Activated:  Yes  Care Coordination Interventions:  Yes, provided   Follow up plan: Follow up call  scheduled for 10/07/22    Encounter Outcome:  Pt. Visit Completed

## 2022-09-24 DIAGNOSIS — Z872 Personal history of diseases of the skin and subcutaneous tissue: Secondary | ICD-10-CM | POA: Diagnosis not present

## 2022-09-24 DIAGNOSIS — L905 Scar conditions and fibrosis of skin: Secondary | ICD-10-CM | POA: Diagnosis not present

## 2022-09-24 DIAGNOSIS — L578 Other skin changes due to chronic exposure to nonionizing radiation: Secondary | ICD-10-CM | POA: Diagnosis not present

## 2022-09-24 DIAGNOSIS — B078 Other viral warts: Secondary | ICD-10-CM | POA: Diagnosis not present

## 2022-09-24 DIAGNOSIS — D225 Melanocytic nevi of trunk: Secondary | ICD-10-CM | POA: Diagnosis not present

## 2022-09-24 DIAGNOSIS — L821 Other seborrheic keratosis: Secondary | ICD-10-CM | POA: Diagnosis not present

## 2022-09-25 DIAGNOSIS — M62830 Muscle spasm of back: Secondary | ICD-10-CM | POA: Diagnosis not present

## 2022-09-25 DIAGNOSIS — M9905 Segmental and somatic dysfunction of pelvic region: Secondary | ICD-10-CM | POA: Diagnosis not present

## 2022-09-25 DIAGNOSIS — M5416 Radiculopathy, lumbar region: Secondary | ICD-10-CM | POA: Diagnosis not present

## 2022-09-25 DIAGNOSIS — M9903 Segmental and somatic dysfunction of lumbar region: Secondary | ICD-10-CM | POA: Diagnosis not present
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
## 2022-09-30 ENCOUNTER — Telehealth (INDEPENDENT_AMBULATORY_CARE_PROVIDER_SITE_OTHER): Payer: 59 | Admitting: Psychiatry

## 2022-09-30 ENCOUNTER — Encounter: Payer: Self-pay | Admitting: Psychiatry

## 2022-09-30 DIAGNOSIS — F1290 Cannabis use, unspecified, uncomplicated: Secondary | ICD-10-CM | POA: Diagnosis not present

## 2022-09-30 DIAGNOSIS — R69 Illness, unspecified: Secondary | ICD-10-CM | POA: Diagnosis not present

## 2022-09-30 DIAGNOSIS — M9905 Segmental and somatic dysfunction of pelvic region: Secondary | ICD-10-CM | POA: Diagnosis not present

## 2022-09-30 DIAGNOSIS — F330 Major depressive disorder, recurrent, mild: Secondary | ICD-10-CM

## 2022-09-30 DIAGNOSIS — M9903 Segmental and somatic dysfunction of lumbar region: Secondary | ICD-10-CM | POA: Diagnosis not present

## 2022-09-30 DIAGNOSIS — F17210 Nicotine dependence, cigarettes, uncomplicated: Secondary | ICD-10-CM

## 2022-09-30 DIAGNOSIS — M5416 Radiculopathy, lumbar region: Secondary | ICD-10-CM | POA: Diagnosis not present

## 2022-09-30 DIAGNOSIS — M62830 Muscle spasm of back: Secondary | ICD-10-CM | POA: Diagnosis not present

## 2022-09-30 MED ORDER — BREXPIPRAZOLE 0.5 MG PO TABS: 0.5000 mg | ORAL_TABLET | Freq: Every day | ORAL | 1 refills | Status: AC

## 2022-09-30 MED ORDER — VENLAFAXINE HCL ER 75 MG PO CP24: 225.0000 mg | ORAL_CAPSULE | Freq: Every day | ORAL | 1 refills | Status: DC

## 2022-09-30 NOTE — Patient Instructions (Signed)
Continue venlafaxine 225 mg daily Start rexulti 0.5 mg at night  Hold quetiapine  Next appointment- 12/11 at 8:30

## 2022-10-02 DIAGNOSIS — M9903 Segmental and somatic dysfunction of lumbar region: Secondary | ICD-10-CM | POA: Diagnosis not present

## 2022-10-02 DIAGNOSIS — M9905 Segmental and somatic dysfunction of pelvic region: Secondary | ICD-10-CM | POA: Diagnosis not present

## 2022-10-02 DIAGNOSIS — M5416 Radiculopathy, lumbar region: Secondary | ICD-10-CM | POA: Diagnosis not present

## 2022-10-02 DIAGNOSIS — M62830 Muscle spasm of back: Secondary | ICD-10-CM | POA: Diagnosis not present

## 2022-10-07 ENCOUNTER — Ambulatory Visit: Payer: Self-pay | Admitting: *Deleted

## 2022-10-07 ENCOUNTER — Telehealth: Payer: Self-pay | Admitting: *Deleted

## 2022-10-07 DIAGNOSIS — M62830 Muscle spasm of back: Secondary | ICD-10-CM | POA: Diagnosis not present

## 2022-10-07 DIAGNOSIS — M9903 Segmental and somatic dysfunction of lumbar region: Secondary | ICD-10-CM | POA: Diagnosis not present

## 2022-10-07 DIAGNOSIS — M5416 Radiculopathy, lumbar region: Secondary | ICD-10-CM | POA: Diagnosis not present

## 2022-10-07 DIAGNOSIS — G4733 Obstructive sleep apnea (adult) (pediatric): Secondary | ICD-10-CM | POA: Diagnosis not present

## 2022-10-07 DIAGNOSIS — M9905 Segmental and somatic dysfunction of pelvic region: Secondary | ICD-10-CM | POA: Diagnosis not present

## 2022-10-07 NOTE — Patient Outreach (Signed)
  Care Coordination   Initial Visit Note   10/07/2022 Name: Kara Russo MRN: [id number] DOB: [birth year]  Kara Russo is a 56 y.o. year old female who sees Leonel Ramsay, MD for primary care. I spoke with  Kara Russo by phone today.  What matters to the patients health and wellness today?  Caregiver support     Goals Addressed             This Visit's Progress    caregiver stress       Care Coordination Interventions: Return call related to caregiver support needs and resources previously provided Status on follow up with the  [facility] the [place] [facility] [phone] for her family member-per patient she plans to call in the upcoming week Patient confirms that she is In the midst of getting POA  for her mother , and updating her mother's will Patient continues to confirm that her caregiver responsibilities can be overwhelming and stressful but confirms feeling much better now that she has a network of support Resources provided for obtaining a Life Alert system thorugh the [facility] [phone], information also provided for free tax preparation for seniors: [employer], [facility]-patient to call and follow up Patient discussed having a history of depression and is currently receiving mental health treatment through [facility] and [place] appointment with [place] 12/01/22 8:30am Solution-Focused Strategies employed:  Active listening / Reflection utilized  Emotional Support Provided Continued use of self-care action plan to manage caregiver stress: self care emphasized, engagement in activities that bring her peace reiterated, follow up with resources provided reinforced         SDOH assessments and interventions completed:  No     Care Coordination Interventions Activated:  Yes   Care Coordination Interventions:  Yes, provided   Follow up plan: Follow up call scheduled for 10/31/22    Encounter Outcome:  Pt. Visit Completed

## 2022-10-07 NOTE — Patient Instructions (Signed)
Visit Information  Thank you for taking time to visit with me today. Please don't hesitate to contact me if I can be of assistance to you.   Following are the goals we discussed today:   Goals Addressed             This Visit's Progress    caregiver stress       Care Coordination Interventions: Return call related to caregiver support needs and resources previously provided Status on follow up with the  [facility] the [place] and [place] [phone] for her family member-per patient she plans to call in the upcoming week Patient confirms that she is In the midst of getting POA  for her mother , and updating her mother's will Patient continues to confirm that her caregiver responsibilities can be overwhelming and stressful but confirms feeling much better now that she has a network of support Resources provided for obtaining a Life Alert system thorugh the [facility] [phone], information also provided for free tax preparation for seniors: [employer], [facility]-patient to call and follow up Patient discussed having a history of depression and is currently receiving mental health treatment through [facility] and [place] appointment with [place] 12/01/22 8:30am Solution-Focused Strategies employed:  Active listening / Reflection utilized  Emotional Support Provided Continued use of self-care action plan to manage caregiver stress: self care emphasized, engagement in activities that bring her peace reiterated, follow up with resources provided reinforced         Our next appointment is by telephone on 10/22/22 at 1pm  Please call the care guide team at [phone] if you need to cancel or reschedule your appointment.   If you are experiencing a Mental Health or [place] or need someone to talk  to, please call the Suicide and Crisis Lifeline: 988 call 911   Patient verbalizes understanding of instructions and care plan provided today and agrees to view in [place]. Active MyChart status and patient understanding of how to access instructions and care plan via MyChart confirmed with patient.     Telephone follow up appointment with care management team member scheduled for: 10/22/22  Elliot Gurney, [place] Worker  [facility] Care Management [phone]

## 2022-10-07 NOTE — Patient Outreach (Signed)
  Care Coordination   10/07/2022 Name: OHANNA GASSERT MRN: [id number] DOB: [birth year]   Care Coordination Outreach Attempts:  An unsuccessful telephone outreach was attempted for a scheduled appointment today.  Follow Up Plan:  No further outreach attempts will be made at this time. We have been unable to contact the patient to offer or enroll patient in care coordination services  Encounter Outcome:  No Answer  Care Coordination Interventions Activated:  No   Care Coordination Interventions:  No, not indicated    Michale Emmerich, [place] Worker  [facility] Care Management [phone]

## 2022-10-14 ENCOUNTER — Telehealth: Payer: Self-pay

## 2022-10-14 DIAGNOSIS — M62830 Muscle spasm of back: Secondary | ICD-10-CM | POA: Diagnosis not present

## 2022-10-14 DIAGNOSIS — M9905 Segmental and somatic dysfunction of pelvic region: Secondary | ICD-10-CM | POA: Diagnosis not present

## 2022-10-14 DIAGNOSIS — M9903 Segmental and somatic dysfunction of lumbar region: Secondary | ICD-10-CM | POA: Diagnosis not present

## 2022-10-14 DIAGNOSIS — M5416 Radiculopathy, lumbar region: Secondary | ICD-10-CM | POA: Diagnosis not present

## 2022-10-14 NOTE — Patient Outreach (Signed)
  Care Coordination   10/14/2022 Name: CHAU SAVELL MRN: [id number] DOB: Dec 06, 1966   Care Coordination Outreach Attempts:  An unsuccessful telephone outreach was attempted for a scheduled appointment today.  Follow Up Plan:  Additional outreach attempts will be made to offer the patient care coordination information and services.   Encounter Outcome:  No Answer  Care Coordination Interventions Activated:  No   Care Coordination Interventions:  No, not indicated    Noreene Larsson RN, MSN, [facility]  Mobile: [phone]

## 2022-10-22 ENCOUNTER — Ambulatory Visit: Payer: Self-pay | Admitting: *Deleted

## 2022-10-22 ENCOUNTER — Encounter: Payer: Self-pay | Admitting: *Deleted

## 2022-10-22 ENCOUNTER — Telehealth: Payer: Self-pay | Admitting: *Deleted

## 2022-10-22 NOTE — Patient Outreach (Signed)
  Care Coordination   10/22/2022 Name: Kara Russo MRN: [id number] DOB: 1966/02/12   Care Coordination Outreach Attempts:  An unsuccessful telephone outreach was attempted for a scheduled appointment today.  Follow Up Plan:  Additional outreach attempts will be made to offer the patient care coordination information and services.   Encounter Outcome:  No Answer  Care Coordination Interventions Activated:  No   Care Coordination Interventions:  No, not indicated    Shakeira Rhee, [place] Worker  [facility] Care Management [phone]

## 2022-10-28 DIAGNOSIS — M9903 Segmental and somatic dysfunction of lumbar region: Secondary | ICD-10-CM | POA: Diagnosis not present

## 2022-10-28 DIAGNOSIS — M9905 Segmental and somatic dysfunction of pelvic region: Secondary | ICD-10-CM | POA: Diagnosis not present

## 2022-10-28 DIAGNOSIS — M5416 Radiculopathy, lumbar region: Secondary | ICD-10-CM | POA: Diagnosis not present

## 2022-10-28 DIAGNOSIS — M62830 Muscle spasm of back: Secondary | ICD-10-CM | POA: Diagnosis not present

## 2022-11-06 ENCOUNTER — Telehealth: Payer: Self-pay | Admitting: *Deleted

## 2022-11-06 NOTE — Progress Notes (Signed)
  Care Coordination Note  11/06/2022 Name: [name] MRN: [id number] DOB: [birth year]  Kara Russo is a 56 y.o. year old female who is a primary care patient of Leonel Ramsay, MD and is actively engaged with the care management team. I reached out to Kara Russo by phone today to assist with re-scheduling a follow up visit with the RN Case Manager  Follow up plan: Unsuccessful telephone outreach attempt made. A HIPAA compliant phone message was left for the patient providing contact information and requesting a return call.   [place]  Direct Dial: [phone]

## 2022-11-07 DIAGNOSIS — G4733 Obstructive sleep apnea (adult) (pediatric): Secondary | ICD-10-CM | POA: Diagnosis not present

## 2022-11-10 ENCOUNTER — Telehealth: Payer: Self-pay

## 2022-11-10 ENCOUNTER — Ambulatory Visit: Payer: 59 | Admitting: Dermatology

## 2022-11-10 NOTE — Telephone Encounter (Signed)
on [email or web address] received notice that the rexulit needed a prior auth

## 2022-11-10 NOTE — Telephone Encounter (Signed)
went online and submitted the prior auth for the rexulti - pending

## 2022-11-11 NOTE — Telephone Encounter (Signed)
rexulti .'5mg'$  was approved from 11-10-22 to 11-11-23

## 2022-11-17 ENCOUNTER — Telehealth: Payer: Self-pay

## 2022-11-17 NOTE — Telephone Encounter (Signed)
Not my patient

## 2022-11-17 NOTE — Telephone Encounter (Signed)
dentist office called pt is going to have a mouth procedure scheduled and they wanted to know if it is safe with the medication she taking to be given a [employer]  [phone]

## 2022-11-18 NOTE — Telephone Encounter (Signed)
Could you ask them which sedative they are planning to use? Although I do not think there would be any contraindication to use most sedatives, please let me know to be on the safe side.

## 2022-11-18 NOTE — Progress Notes (Signed)
  Care Coordination Note  11/18/2022 Name: Kara Russo MRN: [id number] DOB: 21-Jun-1966  Kara Russo is a 56 y.o. year old female who is a primary care patient of Leonel Ramsay, MD and is actively engaged with the care management team. I reached out to Kara Russo by phone today to assist with re-scheduling a follow up visit with the RN Case Manager  Follow up plan: Unsuccessful telephone outreach attempt made. A HIPAA compliant phone message was left for the patient providing contact information and requesting a return call.  We have been unable to make contact with the patient for follow up. The care management team is available to follow up with the patient after provider conversation with the patient regarding recommendation for care management engagement and subsequent re-referral to the care management team.   [place]  Direct Dial: [phone]

## 2022-11-19 DIAGNOSIS — Z720 Tobacco use: Secondary | ICD-10-CM | POA: Diagnosis not present

## 2022-11-19 DIAGNOSIS — R053 Chronic cough: Secondary | ICD-10-CM | POA: Diagnosis not present

## 2022-11-19 DIAGNOSIS — E782 Mixed hyperlipidemia: Secondary | ICD-10-CM | POA: Diagnosis not present

## 2022-11-19 DIAGNOSIS — R490 Dysphonia: Secondary | ICD-10-CM | POA: Diagnosis not present

## 2022-11-19 DIAGNOSIS — Z0000 Encounter for general adult medical examination without abnormal findings: Secondary | ICD-10-CM | POA: Diagnosis not present

## 2022-11-19 DIAGNOSIS — E039 Hypothyroidism, unspecified: Secondary | ICD-10-CM | POA: Diagnosis not present

## 2022-11-19 DIAGNOSIS — G2581 Restless legs syndrome: Secondary | ICD-10-CM | POA: Diagnosis not present

## 2022-11-19 DIAGNOSIS — R69 Illness, unspecified: Secondary | ICD-10-CM | POA: Diagnosis not present

## 2022-11-19 NOTE — Telephone Encounter (Signed)
Left message on the dentist office and I tried to call patient but no answer mailbox full.

## 2022-11-19 NOTE — Telephone Encounter (Signed)
Kara Russo called the office again and stated that they will be using Ativan for the sedative for her dentist appointment.

## 2022-11-19 NOTE — Telephone Encounter (Signed)
Thank you. Yes it is fine to use Ativan.

## 2022-11-27 DIAGNOSIS — R0602 Shortness of breath: Secondary | ICD-10-CM | POA: Diagnosis not present
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
## 2022-12-01 ENCOUNTER — Ambulatory Visit: Payer: 59 | Admitting: Psychiatry
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
## 2022-12-07 DIAGNOSIS — G4733 Obstructive sleep apnea (adult) (pediatric): Secondary | ICD-10-CM | POA: Diagnosis not present

## 2022-12-23 ENCOUNTER — Encounter: Payer: Self-pay | Admitting: Psychiatry

## 2022-12-23 ENCOUNTER — Ambulatory Visit (INDEPENDENT_AMBULATORY_CARE_PROVIDER_SITE_OTHER): Payer: 59 | Admitting: Psychiatry

## 2022-12-23 VITALS — BP 121/78 | HR 78 | Temp 97.5°F | Ht 67.0 in | Wt 157.0 lb

## 2022-12-23 DIAGNOSIS — R69 Illness, unspecified: Secondary | ICD-10-CM | POA: Diagnosis not present

## 2022-12-23 DIAGNOSIS — F331 Major depressive disorder, recurrent, moderate: Secondary | ICD-10-CM | POA: Diagnosis not present

## 2022-12-23 MED ORDER — BREXPIPRAZOLE 1 MG PO TABS: 1.0000 mg | ORAL_TABLET | Freq: Every day | ORAL | 1 refills | Status: DC

## 2022-12-23 MED ORDER — REXULTI 0.5 MG PO TABS: 1.0000 mg | ORAL_TABLET | Freq: Every day | ORAL | 1 refills | Status: DC

## 2022-12-23 NOTE — Patient Instructions (Signed)
Continue venlafaxine 225 mg daily  Increase rexulti 1 mg daily  Obtain EKG at her next visit Next appointment- 2/28 at 11 AM
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
## 2023-01-07 DIAGNOSIS — G4733 Obstructive sleep apnea (adult) (pediatric): Secondary | ICD-10-CM | POA: Diagnosis not present
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
## 2023-02-17 DIAGNOSIS — K219 Gastro-esophageal reflux disease without esophagitis: Secondary | ICD-10-CM | POA: Diagnosis not present

## 2023-02-17 DIAGNOSIS — R112 Nausea with vomiting, unspecified: Secondary | ICD-10-CM | POA: Diagnosis not present

## 2023-02-17 DIAGNOSIS — R197 Diarrhea, unspecified: Secondary | ICD-10-CM | POA: Diagnosis not present

## 2023-02-18 ENCOUNTER — Telehealth: Payer: 59 | Admitting: Psychiatry

## 2023-03-09 ENCOUNTER — Other Ambulatory Visit: Payer: Self-pay | Admitting: Psychiatry

## 2023-03-11 DIAGNOSIS — Z124 Encounter for screening for malignant neoplasm of cervix: Secondary | ICD-10-CM | POA: Diagnosis not present
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
## 2023-03-27 ENCOUNTER — Telehealth (INDEPENDENT_AMBULATORY_CARE_PROVIDER_SITE_OTHER): Payer: 59 | Admitting: Psychiatry

## 2023-03-27 ENCOUNTER — Encounter: Payer: Self-pay | Admitting: Psychiatry

## 2023-03-27 DIAGNOSIS — G2581 Restless legs syndrome: Secondary | ICD-10-CM | POA: Diagnosis not present

## 2023-03-27 DIAGNOSIS — F419 Anxiety disorder, unspecified: Secondary | ICD-10-CM | POA: Diagnosis not present

## 2023-03-27 DIAGNOSIS — F3341 Major depressive disorder, recurrent, in partial remission: Secondary | ICD-10-CM

## 2023-03-27 MED ORDER — BREXPIPRAZOLE 1 MG PO TABS: 1.0000 mg | ORAL_TABLET | Freq: Every day | ORAL | 0 refills | Status: DC

## 2023-03-27 MED ORDER — BUSPIRONE HCL 5 MG PO TABS: 5.0000 mg | ORAL_TABLET | Freq: Two times a day (BID) | ORAL | 1 refills | Status: AC

## 2023-04-02 ENCOUNTER — Ambulatory Visit
Admission: RE | Admit: 2023-04-02 | Discharge: 2023-04-02 | Disposition: A | Discharge status: Home / Self Care | Payer: 59 | Source: Ambulatory Visit | Attending: Infectious Diseases | Admitting: Infectious Diseases

## 2023-04-02 DIAGNOSIS — Z87891 Personal history of nicotine dependence: Secondary | ICD-10-CM

## 2023-04-02 DIAGNOSIS — Z122 Encounter for screening for malignant neoplasm of respiratory organs: Secondary | ICD-10-CM

## 2023-04-02 DIAGNOSIS — F17210 Nicotine dependence, cigarettes, uncomplicated: Secondary | ICD-10-CM | POA: Diagnosis not present

## 2023-04-06 ENCOUNTER — Other Ambulatory Visit: Payer: Self-pay

## 2023-04-06 DIAGNOSIS — Z87891 Personal history of nicotine dependence: Secondary | ICD-10-CM

## 2023-04-06 DIAGNOSIS — Z122 Encounter for screening for malignant neoplasm of respiratory organs: Secondary | ICD-10-CM

## 2023-04-06 DIAGNOSIS — F17210 Nicotine dependence, cigarettes, uncomplicated: Secondary | ICD-10-CM

## 2023-04-17 DIAGNOSIS — F13939 Sedative, hypnotic or anxiolytic use, unspecified with withdrawal, unspecified: Secondary | ICD-10-CM | POA: Diagnosis not present

## 2023-04-17 DIAGNOSIS — F32A Depression, unspecified: Secondary | ICD-10-CM | POA: Diagnosis not present

## 2023-04-17 DIAGNOSIS — R110 Nausea: Secondary | ICD-10-CM | POA: Diagnosis not present
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
## 2023-04-29 ENCOUNTER — Encounter: Payer: Self-pay | Admitting: Psychiatry

## 2023-04-29 ENCOUNTER — Telehealth (INDEPENDENT_AMBULATORY_CARE_PROVIDER_SITE_OTHER): Payer: 59 | Admitting: Psychiatry

## 2023-04-29 DIAGNOSIS — F419 Anxiety disorder, unspecified: Secondary | ICD-10-CM | POA: Diagnosis not present

## 2023-04-29 DIAGNOSIS — F3341 Major depressive disorder, recurrent, in partial remission: Secondary | ICD-10-CM | POA: Diagnosis not present

## 2023-04-29 DIAGNOSIS — G2581 Restless legs syndrome: Secondary | ICD-10-CM | POA: Diagnosis not present

## 2023-04-29 MED ORDER — BREXPIPRAZOLE 1 MG PO TABS: 1.0000 mg | ORAL_TABLET | Freq: Every day | ORAL | 2 refills | Status: DC

## 2023-04-29 NOTE — Patient Instructions (Signed)
Continue venlafaxine 37.5 mg IR twice a day   Continue rexulti 1 mg daily  Continue buspar 5 mg twice a day [facility] Obtain EKG  Next appointment- 6/21 at 10 am

## 2023-06-01 DIAGNOSIS — R232 Flushing: Secondary | ICD-10-CM | POA: Diagnosis not present

## 2023-06-01 DIAGNOSIS — Z780 Asymptomatic menopausal state: Secondary | ICD-10-CM | POA: Diagnosis not present

## 2023-06-01 DIAGNOSIS — R635 Abnormal weight gain: Secondary | ICD-10-CM | POA: Diagnosis not present

## 2023-06-01 DIAGNOSIS — F32A Depression, unspecified: Secondary | ICD-10-CM | POA: Diagnosis not present

## 2023-06-01 DIAGNOSIS — E039 Hypothyroidism, unspecified: Secondary | ICD-10-CM | POA: Diagnosis not present
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
## 2023-06-12 ENCOUNTER — Telehealth (INDEPENDENT_AMBULATORY_CARE_PROVIDER_SITE_OTHER): Payer: 59 | Admitting: Psychiatry

## 2023-06-12 ENCOUNTER — Encounter: Payer: Self-pay | Admitting: Psychiatry

## 2023-06-12 DIAGNOSIS — F330 Major depressive disorder, recurrent, mild: Secondary | ICD-10-CM

## 2023-06-12 DIAGNOSIS — F419 Anxiety disorder, unspecified: Secondary | ICD-10-CM

## 2023-06-12 MED ORDER — VENLAFAXINE HCL 75 MG PO TABS: 75.0000 mg | ORAL_TABLET | Freq: Two times a day (BID) | ORAL | 1 refills | Status: DC

## 2023-06-17 ENCOUNTER — Other Ambulatory Visit: Payer: Self-pay | Admitting: Infectious Diseases

## 2023-06-17 DIAGNOSIS — Z1231 Encounter for screening mammogram for malignant neoplasm of breast: Secondary | ICD-10-CM
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
## 2023-07-27 DIAGNOSIS — M5416 Radiculopathy, lumbar region: Secondary | ICD-10-CM | POA: Diagnosis not present

## 2023-07-27 DIAGNOSIS — M9905 Segmental and somatic dysfunction of pelvic region: Secondary | ICD-10-CM | POA: Diagnosis not present

## 2023-07-27 DIAGNOSIS — M9903 Segmental and somatic dysfunction of lumbar region: Secondary | ICD-10-CM | POA: Diagnosis not present

## 2023-07-27 DIAGNOSIS — M62830 Muscle spasm of back: Secondary | ICD-10-CM | POA: Diagnosis not present

## 2023-07-29 DIAGNOSIS — M9905 Segmental and somatic dysfunction of pelvic region: Secondary | ICD-10-CM | POA: Diagnosis not present

## 2023-07-29 DIAGNOSIS — M62830 Muscle spasm of back: Secondary | ICD-10-CM | POA: Diagnosis not present

## 2023-07-29 DIAGNOSIS — M5416 Radiculopathy, lumbar region: Secondary | ICD-10-CM | POA: Diagnosis not present

## 2023-07-29 DIAGNOSIS — M9903 Segmental and somatic dysfunction of lumbar region: Secondary | ICD-10-CM | POA: Diagnosis not present

## 2023-07-31 ENCOUNTER — Encounter: Payer: Self-pay | Admitting: Psychiatry

## 2023-07-31 ENCOUNTER — Telehealth (INDEPENDENT_AMBULATORY_CARE_PROVIDER_SITE_OTHER): Payer: 59 | Admitting: Psychiatry

## 2023-07-31 DIAGNOSIS — F419 Anxiety disorder, unspecified: Secondary | ICD-10-CM | POA: Diagnosis not present

## 2023-07-31 DIAGNOSIS — Z79899 Other long term (current) drug therapy: Secondary | ICD-10-CM

## 2023-07-31 DIAGNOSIS — G2581 Restless legs syndrome: Secondary | ICD-10-CM | POA: Diagnosis not present

## 2023-07-31 DIAGNOSIS — M5416 Radiculopathy, lumbar region: Secondary | ICD-10-CM | POA: Diagnosis not present

## 2023-07-31 DIAGNOSIS — F330 Major depressive disorder, recurrent, mild: Secondary | ICD-10-CM | POA: Diagnosis not present

## 2023-07-31 DIAGNOSIS — M9905 Segmental and somatic dysfunction of pelvic region: Secondary | ICD-10-CM | POA: Diagnosis not present

## 2023-07-31 DIAGNOSIS — M9903 Segmental and somatic dysfunction of lumbar region: Secondary | ICD-10-CM | POA: Diagnosis not present

## 2023-07-31 DIAGNOSIS — M62830 Muscle spasm of back: Secondary | ICD-10-CM | POA: Diagnosis not present

## 2023-07-31 DIAGNOSIS — F1290 Cannabis use, unspecified, uncomplicated: Secondary | ICD-10-CM | POA: Diagnosis not present

## 2023-07-31 MED ORDER — BREXPIPRAZOLE 1 MG PO TABS: 1.0000 mg | ORAL_TABLET | Freq: Every day | ORAL | 3 refills | Status: DC

## 2023-07-31 MED ORDER — VENLAFAXINE HCL 75 MG PO TABS: 75.0000 mg | ORAL_TABLET | Freq: Two times a day (BID) | ORAL | 1 refills | Status: DC

## 2023-08-03 DIAGNOSIS — M5136 Other intervertebral disc degeneration, lumbar region: Secondary | ICD-10-CM | POA: Diagnosis not present

## 2023-08-03 DIAGNOSIS — R232 Flushing: Secondary | ICD-10-CM | POA: Diagnosis not present

## 2023-08-03 DIAGNOSIS — M5442 Lumbago with sciatica, left side: Secondary | ICD-10-CM | POA: Diagnosis not present

## 2023-08-03 DIAGNOSIS — M5416 Radiculopathy, lumbar region: Secondary | ICD-10-CM | POA: Diagnosis not present

## 2023-08-03 DIAGNOSIS — E039 Hypothyroidism, unspecified: Secondary | ICD-10-CM | POA: Diagnosis not present

## 2023-08-03 DIAGNOSIS — M9903 Segmental and somatic dysfunction of lumbar region: Secondary | ICD-10-CM | POA: Diagnosis not present

## 2023-08-03 DIAGNOSIS — Z780 Asymptomatic menopausal state: Secondary | ICD-10-CM | POA: Diagnosis not present

## 2023-08-03 DIAGNOSIS — M47816 Spondylosis without myelopathy or radiculopathy, lumbar region: Secondary | ICD-10-CM | POA: Diagnosis not present

## 2023-08-03 DIAGNOSIS — I700 Atherosclerosis of aorta: Secondary | ICD-10-CM | POA: Diagnosis not present

## 2023-08-03 DIAGNOSIS — R635 Abnormal weight gain: Secondary | ICD-10-CM | POA: Diagnosis not present

## 2023-08-03 DIAGNOSIS — M62830 Muscle spasm of back: Secondary | ICD-10-CM | POA: Diagnosis not present

## 2023-08-03 DIAGNOSIS — F32A Depression, unspecified: Secondary | ICD-10-CM | POA: Diagnosis not present

## 2023-08-03 DIAGNOSIS — M5432 Sciatica, left side: Secondary | ICD-10-CM | POA: Diagnosis not present

## 2023-08-03 DIAGNOSIS — M9905 Segmental and somatic dysfunction of pelvic region: Secondary | ICD-10-CM | POA: Diagnosis not present

## 2023-08-05 DIAGNOSIS — M5416 Radiculopathy, lumbar region: Secondary | ICD-10-CM | POA: Diagnosis not present

## 2023-08-05 DIAGNOSIS — M62830 Muscle spasm of back: Secondary | ICD-10-CM | POA: Diagnosis not present

## 2023-08-05 DIAGNOSIS — M9905 Segmental and somatic dysfunction of pelvic region: Secondary | ICD-10-CM | POA: Diagnosis not present

## 2023-08-05 DIAGNOSIS — M9903 Segmental and somatic dysfunction of lumbar region: Secondary | ICD-10-CM | POA: Diagnosis not present

## 2023-08-10 DIAGNOSIS — M9905 Segmental and somatic dysfunction of pelvic region: Secondary | ICD-10-CM | POA: Diagnosis not present

## 2023-08-10 DIAGNOSIS — M9903 Segmental and somatic dysfunction of lumbar region: Secondary | ICD-10-CM | POA: Diagnosis not present

## 2023-08-10 DIAGNOSIS — M62830 Muscle spasm of back: Secondary | ICD-10-CM | POA: Diagnosis not present

## 2023-08-10 DIAGNOSIS — M5416 Radiculopathy, lumbar region: Secondary | ICD-10-CM | POA: Diagnosis not present

## 2023-08-12 DIAGNOSIS — M5432 Sciatica, left side: Secondary | ICD-10-CM | POA: Diagnosis not present

## 2023-08-12 DIAGNOSIS — M9905 Segmental and somatic dysfunction of pelvic region: Secondary | ICD-10-CM | POA: Diagnosis not present

## 2023-08-12 DIAGNOSIS — M62830 Muscle spasm of back: Secondary | ICD-10-CM | POA: Diagnosis not present

## 2023-08-12 DIAGNOSIS — M5416 Radiculopathy, lumbar region: Secondary | ICD-10-CM | POA: Diagnosis not present

## 2023-08-12 DIAGNOSIS — M9903 Segmental and somatic dysfunction of lumbar region: Secondary | ICD-10-CM | POA: Diagnosis not present

## 2023-08-14 DIAGNOSIS — M5416 Radiculopathy, lumbar region: Secondary | ICD-10-CM | POA: Diagnosis not present

## 2023-08-14 DIAGNOSIS — M9905 Segmental and somatic dysfunction of pelvic region: Secondary | ICD-10-CM | POA: Diagnosis not present

## 2023-08-14 DIAGNOSIS — M9903 Segmental and somatic dysfunction of lumbar region: Secondary | ICD-10-CM | POA: Diagnosis not present

## 2023-08-14 DIAGNOSIS — M62830 Muscle spasm of back: Secondary | ICD-10-CM | POA: Diagnosis not present

## 2023-08-17 ENCOUNTER — Other Ambulatory Visit: Payer: Self-pay | Admitting: Infectious Diseases

## 2023-08-17 DIAGNOSIS — M9905 Segmental and somatic dysfunction of pelvic region: Secondary | ICD-10-CM | POA: Diagnosis not present

## 2023-08-17 DIAGNOSIS — M9903 Segmental and somatic dysfunction of lumbar region: Secondary | ICD-10-CM | POA: Diagnosis not present

## 2023-08-17 DIAGNOSIS — M62830 Muscle spasm of back: Secondary | ICD-10-CM | POA: Diagnosis not present

## 2023-08-17 DIAGNOSIS — M5416 Radiculopathy, lumbar region: Secondary | ICD-10-CM | POA: Diagnosis not present

## 2023-08-17 DIAGNOSIS — M5432 Sciatica, left side: Secondary | ICD-10-CM

## 2023-08-19 ENCOUNTER — Encounter: Payer: Self-pay | Admitting: Infectious Diseases

## 2023-08-26 DIAGNOSIS — M9905 Segmental and somatic dysfunction of pelvic region: Secondary | ICD-10-CM | POA: Diagnosis not present

## 2023-08-26 DIAGNOSIS — M62830 Muscle spasm of back: Secondary | ICD-10-CM | POA: Diagnosis not present

## 2023-08-26 DIAGNOSIS — M5416 Radiculopathy, lumbar region: Secondary | ICD-10-CM | POA: Diagnosis not present

## 2023-08-26 DIAGNOSIS — M9903 Segmental and somatic dysfunction of lumbar region: Secondary | ICD-10-CM | POA: Diagnosis not present

## 2023-08-27 DIAGNOSIS — G4733 Obstructive sleep apnea (adult) (pediatric): Secondary | ICD-10-CM | POA: Diagnosis not present

## 2023-08-27 DIAGNOSIS — E785 Hyperlipidemia, unspecified: Secondary | ICD-10-CM | POA: Diagnosis not present

## 2023-08-27 DIAGNOSIS — K219 Gastro-esophageal reflux disease without esophagitis: Secondary | ICD-10-CM | POA: Diagnosis not present

## 2023-08-27 DIAGNOSIS — F17210 Nicotine dependence, cigarettes, uncomplicated: Secondary | ICD-10-CM | POA: Diagnosis not present

## 2023-08-27 DIAGNOSIS — Z008 Encounter for other general examination: Secondary | ICD-10-CM | POA: Diagnosis not present

## 2023-08-27 DIAGNOSIS — E039 Hypothyroidism, unspecified: Secondary | ICD-10-CM | POA: Diagnosis not present

## 2023-08-27 DIAGNOSIS — F325 Major depressive disorder, single episode, in full remission: Secondary | ICD-10-CM | POA: Diagnosis not present

## 2023-08-28 DIAGNOSIS — M5416 Radiculopathy, lumbar region: Secondary | ICD-10-CM | POA: Diagnosis not present

## 2023-08-28 DIAGNOSIS — M9905 Segmental and somatic dysfunction of pelvic region: Secondary | ICD-10-CM | POA: Diagnosis not present

## 2023-08-28 DIAGNOSIS — M62830 Muscle spasm of back: Secondary | ICD-10-CM | POA: Diagnosis not present

## 2023-08-28 DIAGNOSIS — M9903 Segmental and somatic dysfunction of lumbar region: Secondary | ICD-10-CM | POA: Diagnosis not present

## 2023-08-29 ENCOUNTER — Ambulatory Visit
Admission: RE | Admit: 2023-08-29 | Discharge: 2023-08-29 | Disposition: A | Discharge status: Home / Self Care | Payer: 59 | Source: Ambulatory Visit | Attending: Infectious Diseases | Admitting: Infectious Diseases

## 2023-08-29 DIAGNOSIS — M47816 Spondylosis without myelopathy or radiculopathy, lumbar region: Secondary | ICD-10-CM | POA: Diagnosis not present

## 2023-08-29 DIAGNOSIS — M5432 Sciatica, left side: Secondary | ICD-10-CM

## 2023-08-29 DIAGNOSIS — M5126 Other intervertebral disc displacement, lumbar region: Secondary | ICD-10-CM | POA: Diagnosis not present

## 2023-08-29 DIAGNOSIS — M48061 Spinal stenosis, lumbar region without neurogenic claudication: Secondary | ICD-10-CM | POA: Diagnosis not present

## 2023-08-31 DIAGNOSIS — M62830 Muscle spasm of back: Secondary | ICD-10-CM | POA: Diagnosis not present

## 2023-08-31 DIAGNOSIS — M9905 Segmental and somatic dysfunction of pelvic region: Secondary | ICD-10-CM | POA: Diagnosis not present

## 2023-08-31 DIAGNOSIS — M5416 Radiculopathy, lumbar region: Secondary | ICD-10-CM | POA: Diagnosis not present

## 2023-08-31 DIAGNOSIS — M9903 Segmental and somatic dysfunction of lumbar region: Secondary | ICD-10-CM | POA: Diagnosis not present

## 2023-09-03 DIAGNOSIS — M9905 Segmental and somatic dysfunction of pelvic region: Secondary | ICD-10-CM | POA: Diagnosis not present

## 2023-09-03 DIAGNOSIS — M62830 Muscle spasm of back: Secondary | ICD-10-CM | POA: Diagnosis not present

## 2023-09-03 DIAGNOSIS — M5416 Radiculopathy, lumbar region: Secondary | ICD-10-CM | POA: Diagnosis not present

## 2023-09-03 DIAGNOSIS — M9903 Segmental and somatic dysfunction of lumbar region: Secondary | ICD-10-CM | POA: Diagnosis not present

## 2023-09-07 DIAGNOSIS — M9903 Segmental and somatic dysfunction of lumbar region: Secondary | ICD-10-CM | POA: Diagnosis not present

## 2023-09-07 DIAGNOSIS — M9905 Segmental and somatic dysfunction of pelvic region: Secondary | ICD-10-CM | POA: Diagnosis not present

## 2023-09-07 DIAGNOSIS — M5416 Radiculopathy, lumbar region: Secondary | ICD-10-CM | POA: Diagnosis not present

## 2023-09-07 DIAGNOSIS — M62830 Muscle spasm of back: Secondary | ICD-10-CM | POA: Diagnosis not present

## 2023-09-10 DIAGNOSIS — G8929 Other chronic pain: Secondary | ICD-10-CM | POA: Diagnosis not present

## 2023-09-10 DIAGNOSIS — M5442 Lumbago with sciatica, left side: Secondary | ICD-10-CM | POA: Diagnosis not present

## 2023-09-10 DIAGNOSIS — M62830 Muscle spasm of back: Secondary | ICD-10-CM | POA: Diagnosis not present

## 2023-09-10 DIAGNOSIS — M5416 Radiculopathy, lumbar region: Secondary | ICD-10-CM | POA: Diagnosis not present

## 2023-09-10 DIAGNOSIS — M9905 Segmental and somatic dysfunction of pelvic region: Secondary | ICD-10-CM | POA: Diagnosis not present

## 2023-09-10 DIAGNOSIS — M9903 Segmental and somatic dysfunction of lumbar region: Secondary | ICD-10-CM | POA: Diagnosis not present

## 2023-09-16 DIAGNOSIS — M5442 Lumbago with sciatica, left side: Secondary | ICD-10-CM | POA: Diagnosis not present

## 2023-09-16 DIAGNOSIS — G8929 Other chronic pain: Secondary | ICD-10-CM | POA: Diagnosis not present

## 2023-09-17 DIAGNOSIS — M62830 Muscle spasm of back: Secondary | ICD-10-CM | POA: Diagnosis not present

## 2023-09-17 DIAGNOSIS — M5416 Radiculopathy, lumbar region: Secondary | ICD-10-CM | POA: Diagnosis not present

## 2023-09-17 DIAGNOSIS — M9903 Segmental and somatic dysfunction of lumbar region: Secondary | ICD-10-CM | POA: Diagnosis not present

## 2023-09-17 DIAGNOSIS — M9905 Segmental and somatic dysfunction of pelvic region: Secondary | ICD-10-CM | POA: Diagnosis not present

## 2023-09-21 DIAGNOSIS — M9903 Segmental and somatic dysfunction of lumbar region: Secondary | ICD-10-CM | POA: Diagnosis not present

## 2023-09-21 DIAGNOSIS — M9905 Segmental and somatic dysfunction of pelvic region: Secondary | ICD-10-CM | POA: Diagnosis not present

## 2023-09-21 DIAGNOSIS — M5416 Radiculopathy, lumbar region: Secondary | ICD-10-CM | POA: Diagnosis not present

## 2023-09-21 DIAGNOSIS — M62830 Muscle spasm of back: Secondary | ICD-10-CM | POA: Diagnosis not present

## 2023-09-23 DIAGNOSIS — M9905 Segmental and somatic dysfunction of pelvic region: Secondary | ICD-10-CM | POA: Diagnosis not present

## 2023-09-23 DIAGNOSIS — M5416 Radiculopathy, lumbar region: Secondary | ICD-10-CM | POA: Diagnosis not present

## 2023-09-23 DIAGNOSIS — M62830 Muscle spasm of back: Secondary | ICD-10-CM | POA: Diagnosis not present

## 2023-09-23 DIAGNOSIS — M9903 Segmental and somatic dysfunction of lumbar region: Secondary | ICD-10-CM | POA: Diagnosis not present
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
## 2023-09-28 DIAGNOSIS — M9903 Segmental and somatic dysfunction of lumbar region: Secondary | ICD-10-CM | POA: Diagnosis not present

## 2023-09-28 DIAGNOSIS — M9905 Segmental and somatic dysfunction of pelvic region: Secondary | ICD-10-CM | POA: Diagnosis not present

## 2023-09-28 DIAGNOSIS — M62830 Muscle spasm of back: Secondary | ICD-10-CM | POA: Diagnosis not present

## 2023-09-28 DIAGNOSIS — M5416 Radiculopathy, lumbar region: Secondary | ICD-10-CM | POA: Diagnosis not present

## 2023-09-30 DIAGNOSIS — R197 Diarrhea, unspecified: Secondary | ICD-10-CM | POA: Diagnosis not present

## 2023-09-30 DIAGNOSIS — M5442 Lumbago with sciatica, left side: Secondary | ICD-10-CM | POA: Diagnosis not present

## 2023-09-30 DIAGNOSIS — R1310 Dysphagia, unspecified: Secondary | ICD-10-CM | POA: Diagnosis not present

## 2023-09-30 DIAGNOSIS — K219 Gastro-esophageal reflux disease without esophagitis: Secondary | ICD-10-CM | POA: Diagnosis not present

## 2023-09-30 DIAGNOSIS — G8929 Other chronic pain: Secondary | ICD-10-CM | POA: Diagnosis not present

## 2023-10-01 DIAGNOSIS — M62830 Muscle spasm of back: Secondary | ICD-10-CM | POA: Diagnosis not present

## 2023-10-01 DIAGNOSIS — M5416 Radiculopathy, lumbar region: Secondary | ICD-10-CM | POA: Diagnosis not present

## 2023-10-01 DIAGNOSIS — M9905 Segmental and somatic dysfunction of pelvic region: Secondary | ICD-10-CM | POA: Diagnosis not present

## 2023-10-01 DIAGNOSIS — M9903 Segmental and somatic dysfunction of lumbar region: Secondary | ICD-10-CM | POA: Diagnosis not present

## 2023-10-02 ENCOUNTER — Telehealth (INDEPENDENT_AMBULATORY_CARE_PROVIDER_SITE_OTHER): Payer: 59 | Admitting: Psychiatry

## 2023-10-02 ENCOUNTER — Encounter: Payer: Self-pay | Admitting: Psychiatry

## 2023-10-02 DIAGNOSIS — F330 Major depressive disorder, recurrent, mild: Secondary | ICD-10-CM

## 2023-10-02 DIAGNOSIS — F419 Anxiety disorder, unspecified: Secondary | ICD-10-CM | POA: Diagnosis not present

## 2023-10-02 MED ORDER — VENLAFAXINE HCL 75 MG PO TABS: 75.0000 mg | ORAL_TABLET | Freq: Two times a day (BID) | ORAL | 5 refills | Status: DC

## 2023-10-02 NOTE — Patient Instructions (Signed)
Continue venlafaxine 75 mg IR twice a day  Continue rexulti 1 mg daily  Continue Buspar 5 mg twice a day [facility]btain labs at [facility] Obtain EKG - please call [phone]  to make an appointment  Next appointment- 12/6 at 10 am

## 2023-10-06 DIAGNOSIS — M9905 Segmental and somatic dysfunction of pelvic region: Secondary | ICD-10-CM | POA: Diagnosis not present

## 2023-10-06 DIAGNOSIS — M62830 Muscle spasm of back: Secondary | ICD-10-CM | POA: Diagnosis not present

## 2023-10-06 DIAGNOSIS — M9903 Segmental and somatic dysfunction of lumbar region: Secondary | ICD-10-CM | POA: Diagnosis not present

## 2023-10-06 DIAGNOSIS — M5416 Radiculopathy, lumbar region: Secondary | ICD-10-CM | POA: Diagnosis not present

## 2023-10-15 DIAGNOSIS — M5126 Other intervertebral disc displacement, lumbar region: Secondary | ICD-10-CM | POA: Diagnosis not present

## 2023-10-15 DIAGNOSIS — M5416 Radiculopathy, lumbar region: Secondary | ICD-10-CM | POA: Diagnosis not present

## 2023-10-20 DIAGNOSIS — M5416 Radiculopathy, lumbar region: Secondary | ICD-10-CM | POA: Diagnosis not present

## 2023-10-20 DIAGNOSIS — M5126 Other intervertebral disc displacement, lumbar region: Secondary | ICD-10-CM | POA: Diagnosis not present

## 2023-10-28 ENCOUNTER — Ambulatory Visit: Payer: 59 | Attending: Infectious Diseases

## 2023-11-05 DIAGNOSIS — M5416 Radiculopathy, lumbar region: Secondary | ICD-10-CM | POA: Diagnosis not present

## 2023-11-05 DIAGNOSIS — M9905 Segmental and somatic dysfunction of pelvic region: Secondary | ICD-10-CM | POA: Diagnosis not present

## 2023-11-05 DIAGNOSIS — M62830 Muscle spasm of back: Secondary | ICD-10-CM | POA: Diagnosis not present

## 2023-11-05 DIAGNOSIS — M9903 Segmental and somatic dysfunction of lumbar region: Secondary | ICD-10-CM | POA: Diagnosis not present

## 2023-11-11 ENCOUNTER — Telehealth: Payer: Self-pay

## 2023-11-11 NOTE — Telephone Encounter (Signed)
received email from [email or web address] requesting a prior auth for the rexulti

## 2023-11-11 NOTE — Telephone Encounter (Signed)
went online to [email or web address] and submitted the prior auth . - pending 

## 2023-11-11 NOTE — Telephone Encounter (Signed)
received fax that the rexulti was approved from 11-11-23 to 11-10-24

## 2023-11-12 DIAGNOSIS — M5416 Radiculopathy, lumbar region: Secondary | ICD-10-CM | POA: Diagnosis not present

## 2023-11-12 DIAGNOSIS — M5126 Other intervertebral disc displacement, lumbar region: Secondary | ICD-10-CM | POA: Diagnosis not present

## 2023-11-18 NOTE — Telephone Encounter (Signed)
Error
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
## 2023-11-27 ENCOUNTER — Encounter: Payer: Self-pay | Admitting: Psychiatry

## 2023-11-27 ENCOUNTER — Telehealth (INDEPENDENT_AMBULATORY_CARE_PROVIDER_SITE_OTHER): Payer: 59 | Admitting: Psychiatry

## 2023-11-27 DIAGNOSIS — F1290 Cannabis use, unspecified, uncomplicated: Secondary | ICD-10-CM

## 2023-11-27 DIAGNOSIS — G2581 Restless legs syndrome: Secondary | ICD-10-CM

## 2023-11-27 DIAGNOSIS — F419 Anxiety disorder, unspecified: Secondary | ICD-10-CM

## 2023-11-27 DIAGNOSIS — F330 Major depressive disorder, recurrent, mild: Secondary | ICD-10-CM | POA: Diagnosis not present

## 2023-11-27 MED ORDER — BREXPIPRAZOLE 1 MG PO TABS: 1.0000 mg | ORAL_TABLET | Freq: Every day | ORAL | 3 refills | Status: DC

## 2023-11-27 MED ORDER — BUSPIRONE HCL 5 MG PO TABS: 5.0000 mg | ORAL_TABLET | Freq: Two times a day (BID) | ORAL | 1 refills | Status: DC

## 2023-12-02 DIAGNOSIS — M5126 Other intervertebral disc displacement, lumbar region: Secondary | ICD-10-CM | POA: Diagnosis not present

## 2023-12-02 DIAGNOSIS — M5416 Radiculopathy, lumbar region: Secondary | ICD-10-CM | POA: Diagnosis not present

## 2023-12-03 DIAGNOSIS — F32A Depression, unspecified: Secondary | ICD-10-CM | POA: Diagnosis not present

## 2023-12-03 DIAGNOSIS — M5432 Sciatica, left side: Secondary | ICD-10-CM | POA: Diagnosis not present

## 2023-12-03 DIAGNOSIS — E039 Hypothyroidism, unspecified: Secondary | ICD-10-CM | POA: Diagnosis not present

## 2023-12-03 DIAGNOSIS — G2581 Restless legs syndrome: Secondary | ICD-10-CM | POA: Diagnosis not present

## 2023-12-03 DIAGNOSIS — E782 Mixed hyperlipidemia: Secondary | ICD-10-CM | POA: Diagnosis not present

## 2023-12-03 DIAGNOSIS — Z780 Asymptomatic menopausal state: Secondary | ICD-10-CM | POA: Diagnosis not present

## 2023-12-03 DIAGNOSIS — Z720 Tobacco use: Secondary | ICD-10-CM | POA: Diagnosis not present

## 2023-12-07 ENCOUNTER — Telehealth: Payer: Self-pay

## 2023-12-07 ENCOUNTER — Ambulatory Visit
Admission: RE | Admit: 2023-12-07 | Discharge: 2023-12-07 | Disposition: A | Discharge status: Home / Self Care | Payer: 59 | Source: Ambulatory Visit | Attending: Psychiatry | Admitting: Psychiatry

## 2023-12-07 DIAGNOSIS — Z79899 Other long term (current) drug therapy: Secondary | ICD-10-CM | POA: Diagnosis not present

## 2023-12-07 NOTE — Telephone Encounter (Signed)
Received fax for Prior Authorization for patients Rexulti  1 mg tablet initiated via CoverMyMeds and sent to patients insurance for approval   Received fax stating that the Rexulti 1 mg was approved  Coverage 12/07/23---12/06/24 Called to make patient aware no answer left voicemail for patient to return call to office

## 2023-12-22 DIAGNOSIS — M5126 Other intervertebral disc displacement, lumbar region: Secondary | ICD-10-CM | POA: Diagnosis not present

## 2023-12-22 DIAGNOSIS — M5416 Radiculopathy, lumbar region: Secondary | ICD-10-CM | POA: Diagnosis not present

## 2024-01-22 ENCOUNTER — Other Ambulatory Visit: Payer: Self-pay | Admitting: Psychiatry
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
## 2024-01-27 ENCOUNTER — Telehealth (INDEPENDENT_AMBULATORY_CARE_PROVIDER_SITE_OTHER): Payer: 59 | Admitting: Psychiatry

## 2024-01-27 ENCOUNTER — Encounter: Payer: Self-pay | Admitting: Psychiatry

## 2024-01-27 DIAGNOSIS — F419 Anxiety disorder, unspecified: Secondary | ICD-10-CM

## 2024-01-27 DIAGNOSIS — F330 Major depressive disorder, recurrent, mild: Secondary | ICD-10-CM

## 2024-01-27 DIAGNOSIS — G2581 Restless legs syndrome: Secondary | ICD-10-CM | POA: Diagnosis not present

## 2024-01-27 MED ORDER — VENLAFAXINE HCL 100 MG PO TABS: 100.0000 mg | ORAL_TABLET | Freq: Two times a day (BID) | ORAL | 1 refills | Status: DC

## 2024-01-27 MED ORDER — BUSPIRONE HCL 5 MG PO TABS: 5.0000 mg | ORAL_TABLET | Freq: Two times a day (BID) | ORAL | 0 refills | Status: AC

## 2024-01-27 NOTE — Patient Instructions (Signed)
 Increase venlafaxine  100 mg IR twice a day  Continue rexulti  1 mg daily  Continue Buspar  5 mg twice a day Next appointment- 4/1 at 4 30
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
## 2024-03-22 ENCOUNTER — Telehealth: Payer: 59 | Admitting: Psychiatry

## 2024-03-25 ENCOUNTER — Telehealth (INDEPENDENT_AMBULATORY_CARE_PROVIDER_SITE_OTHER): Admitting: Psychiatry

## 2024-03-25 ENCOUNTER — Encounter: Payer: Self-pay | Admitting: Psychiatry

## 2024-03-25 DIAGNOSIS — F330 Major depressive disorder, recurrent, mild: Secondary | ICD-10-CM | POA: Diagnosis not present

## 2024-03-25 DIAGNOSIS — F419 Anxiety disorder, unspecified: Secondary | ICD-10-CM | POA: Diagnosis not present

## 2024-03-25 DIAGNOSIS — G2581 Restless legs syndrome: Secondary | ICD-10-CM

## 2024-03-25 DIAGNOSIS — G4700 Insomnia, unspecified: Secondary | ICD-10-CM

## 2024-03-25 DIAGNOSIS — F1290 Cannabis use, unspecified, uncomplicated: Secondary | ICD-10-CM | POA: Diagnosis not present

## 2024-03-25 MED ORDER — VENLAFAXINE HCL 100 MG PO TABS: 100.0000 mg | ORAL_TABLET | Freq: Two times a day (BID) | ORAL | 1 refills | Status: AC

## 2024-03-25 MED ORDER — BREXPIPRAZOLE 1 MG PO TABS: 1.0000 mg | ORAL_TABLET | Freq: Every day | ORAL | 3 refills | Status: AC

## 2024-03-25 MED ORDER — TRAZODONE HCL 50 MG PO TABS: 25.0000 mg | ORAL_TABLET | Freq: Every day | ORAL | 1 refills | Status: DC

## 2024-03-25 NOTE — Patient Instructions (Signed)
 Continue venlafaxine 100 mg IR twice a day  Continue rexulti 1 mg daily  Continue Buspar 5 mg twice a day Start trazodone 25-50 mg at night as needed for insomnia Next appointment- 5/16 at 11 30
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
## 2024-04-12 ENCOUNTER — Other Ambulatory Visit: Payer: Self-pay | Admitting: Acute Care

## 2024-04-12 DIAGNOSIS — Z87891 Personal history of nicotine dependence: Secondary | ICD-10-CM

## 2024-04-12 DIAGNOSIS — F17210 Nicotine dependence, cigarettes, uncomplicated: Secondary | ICD-10-CM

## 2024-04-12 DIAGNOSIS — Z122 Encounter for screening for malignant neoplasm of respiratory organs: Secondary | ICD-10-CM

## 2024-04-29 ENCOUNTER — Ambulatory Visit
Admission: RE | Admit: 2024-04-29 | Discharge: 2024-04-29 | Disposition: A | Discharge status: Home / Self Care | Source: Ambulatory Visit | Attending: Acute Care | Admitting: Acute Care

## 2024-04-29 DIAGNOSIS — Z122 Encounter for screening for malignant neoplasm of respiratory organs: Secondary | ICD-10-CM | POA: Diagnosis not present

## 2024-04-29 DIAGNOSIS — Z87891 Personal history of nicotine dependence: Secondary | ICD-10-CM

## 2024-04-29 DIAGNOSIS — F17210 Nicotine dependence, cigarettes, uncomplicated: Secondary | ICD-10-CM | POA: Diagnosis not present

## 2024-05-01 NOTE — Progress Notes (Unsigned)
 No show

## 2024-05-06 ENCOUNTER — Telehealth (INDEPENDENT_AMBULATORY_CARE_PROVIDER_SITE_OTHER): Admitting: Psychiatry

## 2024-05-06 DIAGNOSIS — Z91199 Patient's noncompliance with other medical treatment and regimen due to unspecified reason: Secondary | ICD-10-CM

## 2024-05-23 ENCOUNTER — Other Ambulatory Visit: Payer: Self-pay

## 2024-05-23 DIAGNOSIS — Z122 Encounter for screening for malignant neoplasm of respiratory organs: Secondary | ICD-10-CM

## 2024-05-23 DIAGNOSIS — Z87891 Personal history of nicotine dependence: Secondary | ICD-10-CM

## 2024-05-23 DIAGNOSIS — F17210 Nicotine dependence, cigarettes, uncomplicated: Secondary | ICD-10-CM

## 2024-06-02 DIAGNOSIS — Z0000 Encounter for general adult medical examination without abnormal findings: Secondary | ICD-10-CM | POA: Diagnosis not present

## 2024-06-02 DIAGNOSIS — E039 Hypothyroidism, unspecified: Secondary | ICD-10-CM | POA: Diagnosis not present

## 2024-06-02 DIAGNOSIS — E782 Mixed hyperlipidemia: Secondary | ICD-10-CM | POA: Diagnosis not present

## 2024-06-07 ENCOUNTER — Other Ambulatory Visit: Payer: Self-pay | Admitting: Infectious Diseases

## 2024-06-07 DIAGNOSIS — Z1231 Encounter for screening mammogram for malignant neoplasm of breast: Secondary | ICD-10-CM

## 2024-06-17 DIAGNOSIS — E876 Hypokalemia: Secondary | ICD-10-CM | POA: Diagnosis not present

## 2024-06-17 DIAGNOSIS — K219 Gastro-esophageal reflux disease without esophagitis: Secondary | ICD-10-CM | POA: Diagnosis not present

## 2024-06-17 DIAGNOSIS — R110 Nausea: Secondary | ICD-10-CM | POA: Diagnosis not present

## 2024-06-21 DIAGNOSIS — F32A Depression, unspecified: Secondary | ICD-10-CM | POA: Diagnosis not present

## 2024-06-21 DIAGNOSIS — N8110 Cystocele, unspecified: Secondary | ICD-10-CM | POA: Diagnosis not present

## 2024-06-21 DIAGNOSIS — G2581 Restless legs syndrome: Secondary | ICD-10-CM | POA: Diagnosis not present

## 2024-06-21 DIAGNOSIS — G4733 Obstructive sleep apnea (adult) (pediatric): Secondary | ICD-10-CM | POA: Diagnosis not present

## 2024-06-21 DIAGNOSIS — Z720 Tobacco use: Secondary | ICD-10-CM | POA: Diagnosis not present

## 2024-06-21 DIAGNOSIS — M5432 Sciatica, left side: Secondary | ICD-10-CM | POA: Diagnosis not present

## 2024-06-21 DIAGNOSIS — E039 Hypothyroidism, unspecified: Secondary | ICD-10-CM | POA: Diagnosis not present

## 2024-06-21 DIAGNOSIS — Z0000 Encounter for general adult medical examination without abnormal findings: Secondary | ICD-10-CM | POA: Diagnosis not present

## 2024-06-21 DIAGNOSIS — E782 Mixed hyperlipidemia: Secondary | ICD-10-CM | POA: Diagnosis not present

## 2024-06-21 DIAGNOSIS — R002 Palpitations: Secondary | ICD-10-CM | POA: Diagnosis not present

## 2024-06-21 DIAGNOSIS — Z780 Asymptomatic menopausal state: Secondary | ICD-10-CM | POA: Diagnosis not present

## 2024-07-08 DIAGNOSIS — R030 Elevated blood-pressure reading, without diagnosis of hypertension: Secondary | ICD-10-CM | POA: Diagnosis not present

## 2024-07-08 DIAGNOSIS — R932 Abnormal findings on diagnostic imaging of liver and biliary tract: Secondary | ICD-10-CM | POA: Diagnosis not present

## 2024-07-08 DIAGNOSIS — E876 Hypokalemia: Secondary | ICD-10-CM | POA: Diagnosis not present

## 2024-07-12 ENCOUNTER — Other Ambulatory Visit: Payer: Self-pay | Admitting: Infectious Diseases

## 2024-07-12 DIAGNOSIS — R932 Abnormal findings on diagnostic imaging of liver and biliary tract: Secondary | ICD-10-CM

## 2024-07-12 DIAGNOSIS — E876 Hypokalemia: Secondary | ICD-10-CM

## 2024-07-12 DIAGNOSIS — R030 Elevated blood-pressure reading, without diagnosis of hypertension: Secondary | ICD-10-CM

## 2024-07-13 ENCOUNTER — Ambulatory Visit
Admission: RE | Admit: 2024-07-13 | Discharge: 2024-07-13 | Disposition: A | Discharge status: Home / Self Care | Source: Ambulatory Visit | Attending: Infectious Diseases | Admitting: Infectious Diseases

## 2024-07-13 DIAGNOSIS — E876 Hypokalemia: Secondary | ICD-10-CM | POA: Diagnosis not present

## 2024-07-13 DIAGNOSIS — R030 Elevated blood-pressure reading, without diagnosis of hypertension: Secondary | ICD-10-CM | POA: Diagnosis not present

## 2024-07-13 DIAGNOSIS — K8020 Calculus of gallbladder without cholecystitis without obstruction: Secondary | ICD-10-CM | POA: Diagnosis not present

## 2024-07-13 DIAGNOSIS — R932 Abnormal findings on diagnostic imaging of liver and biliary tract: Secondary | ICD-10-CM | POA: Diagnosis not present

## 2024-07-13 DIAGNOSIS — R7989 Other specified abnormal findings of blood chemistry: Secondary | ICD-10-CM | POA: Diagnosis not present

## 2024-08-01 DIAGNOSIS — J069 Acute upper respiratory infection, unspecified: Secondary | ICD-10-CM | POA: Diagnosis not present

## 2024-08-01 DIAGNOSIS — R030 Elevated blood-pressure reading, without diagnosis of hypertension: Secondary | ICD-10-CM | POA: Diagnosis not present

## 2024-08-11 DIAGNOSIS — R1011 Right upper quadrant pain: Secondary | ICD-10-CM | POA: Diagnosis not present

## 2024-08-11 DIAGNOSIS — K8020 Calculus of gallbladder without cholecystitis without obstruction: Secondary | ICD-10-CM | POA: Diagnosis not present

## 2024-08-16 DIAGNOSIS — K8020 Calculus of gallbladder without cholecystitis without obstruction: Secondary | ICD-10-CM | POA: Diagnosis not present

## 2024-09-15 ENCOUNTER — Emergency Department
Admission: EM | Admit: 2024-09-15 | Discharge: 2024-09-15 | Disposition: A | Discharge status: Home / Self Care | Attending: Emergency Medicine | Admitting: Emergency Medicine

## 2024-09-15 ENCOUNTER — Other Ambulatory Visit: Payer: Self-pay

## 2024-09-15 DIAGNOSIS — Z87891 Personal history of nicotine dependence: Secondary | ICD-10-CM | POA: Diagnosis not present

## 2024-09-15 DIAGNOSIS — R1115 Cyclical vomiting syndrome unrelated to migraine: Secondary | ICD-10-CM | POA: Diagnosis not present

## 2024-09-15 DIAGNOSIS — R112 Nausea with vomiting, unspecified: Secondary | ICD-10-CM | POA: Diagnosis not present

## 2024-09-15 DIAGNOSIS — E876 Hypokalemia: Secondary | ICD-10-CM | POA: Diagnosis not present

## 2024-09-15 DIAGNOSIS — D72829 Elevated white blood cell count, unspecified: Secondary | ICD-10-CM | POA: Insufficient documentation

## 2024-09-15 LAB — COMPREHENSIVE METABOLIC PANEL WITH GFR
ALT: 17 U/L (ref 0–44)
AST: 28 U/L (ref 15–41)
Albumin: 4.1 g/dL (ref 3.5–5.0)
Alkaline Phosphatase: 94 U/L (ref 38–126)
Anion gap: 15 (ref 5–15)
BUN: 9 mg/dL (ref 6–20)
CO2: 27 mmol/L (ref 22–32)
Calcium: 9.6 mg/dL (ref 8.9–10.3)
Chloride: 97 mmol/L — ABNORMAL LOW (ref 98–111)
Creatinine, Ser: 0.83 mg/dL (ref 0.44–1.00)
GFR, Estimated: >60 mL/min (ref >60)
Glucose, Bld: 133 mg/dL — ABNORMAL HIGH (ref 70–99)
Potassium: 3.1 mmol/L — ABNORMAL LOW (ref 3.5–5.1)
Sodium: 139 mmol/L (ref 135–145)
Total Bilirubin: 1.7 mg/dL — ABNORMAL HIGH (ref 0.0–1.2)
Total Protein: 7.7 g/dL (ref 6.5–8.1)

## 2024-09-15 LAB — URINALYSIS, ROUTINE W REFLEX MICROSCOPIC
Bilirubin Urine: NEGATIVE
Glucose, UA: NEGATIVE mg/dL
Ketones, ur: 20 mg/dL — AB
Nitrite: NEGATIVE
Protein, ur: 100 mg/dL — AB
Specific Gravity, Urine: 1.027 (ref 1.005–1.030)
pH: 6 (ref 5.0–8.0)

## 2024-09-15 LAB — CBC
HCT: 45.2 % (ref 36.0–46.0)
Hemoglobin: 15.9 g/dL — ABNORMAL HIGH (ref 12.0–15.0)
MCH: 33.3 pg (ref 26.0–34.0)
MCHC: 35.2 g/dL (ref 30.0–36.0)
MCV: 94.8 fL (ref 80.0–100.0)
Platelets: 237 K/uL (ref 150–400)
RBC: 4.77 MIL/uL (ref 3.87–5.11)
RDW: 13.5 % (ref 11.5–15.5)
WBC: 10.8 K/uL — ABNORMAL HIGH (ref 4.0–10.5)
nRBC: 0 % (ref 0.0–0.2)

## 2024-09-15 LAB — LIPASE, BLOOD: Lipase: 15 U/L (ref 11–51)

## 2024-09-15 MED ORDER — ONDANSETRON HCL 4 MG/2ML IJ SOLN
4.0000 mg | Freq: Once | INTRAMUSCULAR | Status: AC
  Administered 2024-09-15: 4 mg via INTRAVENOUS
  Filled 2024-09-15: qty 2

## 2024-09-15 MED ORDER — ONDANSETRON 4 MG PO TBDP: 4.0000 mg | ORAL_TABLET | Freq: Three times a day (TID) | ORAL | 0 refills | Status: AC | PRN

## 2024-09-15 MED ORDER — SODIUM CHLORIDE 0.9 % IV SOLN: Freq: Once | INTRAVENOUS | Status: AC

## 2024-09-15 NOTE — ED Provider Notes (Signed)
 [facility] Provider Note    Event Date/Time   First MD Initiated Contact with Patient 09/15/24 1140     (approximate)   History   Emesis  HPI  Kara Russo is a 58 y.o. female with a history of cigarette usage and marijuana usage who presents with complaints of nausea and vomiting she had 1 episode of loose stools.  This has been going on for 2 days.  She reports this seems to happen on a monthly basis.     Physical Exam   Triage Vital Signs: ED Triage Vitals  Encounter Vitals Group     BP 09/15/24 1101 (!) 138/110     Girls Systolic BP Percentile --      Girls Diastolic BP Percentile --      Boys Systolic BP Percentile --      Boys Diastolic BP Percentile --      Pulse Rate 09/15/24 1101 80     Resp 09/15/24 1101 20     Temp 09/15/24 1101 97.7 F (36.5 C)     Temp src --      SpO2 09/15/24 1101 99 %     Weight 09/15/24 1100 71.2 kg (157 lb)     Height 09/15/24 1100 1.702 m (5' 7)     Head Circumference --      Peak Flow --      Pain Score 09/15/24 1100 0     Pain Loc --      Pain Education --      Exclude from Growth Chart --     Most recent vital signs: Vitals:   09/15/24 1101  BP: (!) 138/110  Pulse: 80  Resp: 20  Temp: 97.7 F (36.5 C)  SpO2: 99%     General: Awake, no distress.  CV:  Good peripheral perfusion.  Resp:  Normal effort.  Abd:  No distention.  Soft, nontender, reassuring exam Other:     ED Results / Procedures / Treatments   Labs (all labs ordered are listed, but only abnormal results are displayed) Labs Reviewed  COMPREHENSIVE METABOLIC PANEL WITH GFR - Abnormal; Notable for the following components:      Result Value   Potassium 3.1 (*)    Chloride 97 (*)    Glucose, Bld 133 (*)    Total Bilirubin 1.7 (*)    All other components within normal limits  URINALYSIS, ROUTINE W REFLEX MICROSCOPIC - Abnormal; Notable for the following components:   Color, Urine AMBER (*)    APPearance CLOUDY (*)     Hgb urine dipstick SMALL (*)    Ketones, ur 20 (*)    Protein, ur 100 (*)    Leukocytes,Ua LARGE (*)    Bacteria, UA MANY (*)    All other components within normal limits  CBC - Abnormal; Notable for the following components:   WBC 10.8 (*)    Hemoglobin 15.9 (*)    All other components within normal limits  LIPASE, BLOOD     EKG     RADIOLOGY     PROCEDURES:  Critical Care performed:   Procedures   MEDICATIONS ORDERED IN ED: Medications  ondansetron  (ZOFRAN ) injection 4 mg (4 mg Intravenous Given 09/15/24 1223)  0.9 %  sodium chloride  infusion ( Intravenous New Bag/Given 09/15/24 1224)     IMPRESSION / MDM / ASSESSMENT AND PLAN / ED COURSE  I reviewed the triage vital signs and the nursing notes. Patient's presentation is most consistent  with severe exacerbation of chronic illness.  Patient presents with nausea vomiting as detailed above, differential includes viral gastroenteritis versus cyclical vomiting.  She notes that this seems to happen on a monthly basis, she does smoke marijuana monthly, I discussed with her the likelihood of cannabis hyperemesis.  Will treat with IV fluids, IV Zofran  here, lab work reviewed and is overall reassuring.  Mild hypokalemia.  ----------------------------------------- 1:21 PM on 09/15/2024 ----------------------------------------- Patient is feeling improved, no indication for admission at this time, will discharge with ODT Zofran , marijuana cessation counseling provided, return precautions discussed      FINAL CLINICAL IMPRESSION(S) / ED DIAGNOSES   Final diagnoses:  Cyclical vomiting syndrome not associated with migraine     Rx / DC Orders   ED Discharge Orders          Ordered    ondansetron  (ZOFRAN -ODT) 4 MG disintegrating tablet  Every 8 hours PRN        09/15/24 1320             Note:  This document was prepared using Dragon voice recognition software and may include unintentional dictation  errors.   Arlander Charleston, MD 09/15/24 1322

## 2024-09-15 NOTE — ED Triage Notes (Signed)
 Pt to ED for emesis/diarrhea x2 days. Reports has gallstones.

## 2024-09-21 ENCOUNTER — Ambulatory Visit: Payer: Self-pay | Admitting: General Surgery

## 2024-09-22 ENCOUNTER — Inpatient Hospital Stay: Admission: RE | Admit: 2024-09-22 | Source: Ambulatory Visit

## 2024-09-23 ENCOUNTER — Encounter
Admission: RE | Admit: 2024-09-23 | Discharge: 2024-09-23 | Disposition: A | Discharge status: Home / Self Care | Source: Ambulatory Visit | Attending: General Surgery | Admitting: General Surgery

## 2024-09-23 ENCOUNTER — Encounter: Payer: Self-pay | Admitting: Urgent Care

## 2024-09-23 ENCOUNTER — Ambulatory Visit: Payer: Self-pay | Admitting: Urgent Care

## 2024-09-23 ENCOUNTER — Other Ambulatory Visit: Payer: Self-pay

## 2024-09-23 DIAGNOSIS — E876 Hypokalemia: Secondary | ICD-10-CM

## 2024-09-23 DIAGNOSIS — Z01818 Encounter for other preprocedural examination: Secondary | ICD-10-CM | POA: Diagnosis not present

## 2024-09-23 DIAGNOSIS — Z01812 Encounter for preprocedural laboratory examination: Secondary | ICD-10-CM | POA: Insufficient documentation

## 2024-09-23 DIAGNOSIS — K8020 Calculus of gallbladder without cholecystitis without obstruction: Secondary | ICD-10-CM | POA: Insufficient documentation

## 2024-09-23 DIAGNOSIS — D72829 Elevated white blood cell count, unspecified: Secondary | ICD-10-CM | POA: Diagnosis not present

## 2024-09-23 HISTORY — DX: Unspecified osteoarthritis, unspecified site: M19.90

## 2024-09-23 HISTORY — DX: Diffuse cystic mastopathy of unspecified breast: N60.19

## 2024-09-23 LAB — BASIC METABOLIC PANEL WITH GFR
Anion gap: 10 (ref 5–15)
BUN: <5 mg/dL — ABNORMAL LOW (ref 6–20)
CO2: 32 mmol/L (ref 22–32)
Calcium: 8.7 mg/dL — ABNORMAL LOW (ref 8.9–10.3)
Chloride: 98 mmol/L (ref 98–111)
Creatinine, Ser: 0.64 mg/dL (ref 0.44–1.00)
GFR, Estimated: >60 mL/min (ref >60)
Glucose, Bld: 120 mg/dL — ABNORMAL HIGH (ref 70–99)
Potassium: 2.9 mmol/L — ABNORMAL LOW (ref 3.5–5.1)
Sodium: 140 mmol/L (ref 135–145)

## 2024-09-23 LAB — URINALYSIS, COMPLETE (UACMP) WITH MICROSCOPIC
Bacteria, UA: NONE SEEN
Bilirubin Urine: NEGATIVE
Glucose, UA: NEGATIVE mg/dL
Hgb urine dipstick: NEGATIVE
Ketones, ur: NEGATIVE mg/dL
Nitrite: NEGATIVE
Protein, ur: NEGATIVE mg/dL
Specific Gravity, Urine: 1.004 — ABNORMAL LOW (ref 1.005–1.030)
pH: 7 (ref 5.0–8.0)

## 2024-09-23 LAB — CBC
HCT: 42.1 % (ref 36.0–46.0)
Hemoglobin: 14.1 g/dL (ref 12.0–15.0)
MCH: 32.4 pg (ref 26.0–34.0)
MCHC: 33.5 g/dL (ref 30.0–36.0)
MCV: 96.8 fL (ref 80.0–100.0)
Platelets: 209 K/uL (ref 150–400)
RBC: 4.35 MIL/uL (ref 3.87–5.11)
RDW: 13.1 % (ref 11.5–15.5)
WBC: 8.7 K/uL (ref 4.0–10.5)
nRBC: 0 % (ref 0.0–0.2)

## 2024-09-23 LAB — MAGNESIUM: Magnesium: 2.3 mg/dL (ref 1.7–2.4)

## 2024-09-23 MED ORDER — POTASSIUM CHLORIDE CRYS ER 20 MEQ PO TBCR: EXTENDED_RELEASE_TABLET | ORAL | 0 refills | Status: AC

## 2024-09-23 NOTE — Progress Notes (Signed)
 [facility] Perioperative Services: Pre-Admission/Anesthesia Testing  Abnormal Lab Notification and Treatment Plan of Care   Date: 09/23/24  Name: Kara Russo DOB: 1966-05-31 MRN:   [id number]  Re: Abnormal labs noted during PAT appointment   Notified:  Provider Name Provider Role Notification Mode  Rodolph Romano, MD  General Surgery (Surgeon) Routed and/or faxed via [facility] Epifanio Alm SHAUNNA, MD Primary Care Provider Routed and/or faxed via [facility]   Clinical Information and Notes:  ABNORMAL LAB VALUE(S): Lab Results  Component Value Date   K 2.9 (L) 09/23/2024   Kara Russo is scheduled for an elective CHOLECYSTECTOMY, ROBOT-ASSISTED, LAPAROSCOPIC on 09/26/2024. In review of her medication reconciliation, it is noted that the patient is NOT taking prescribed diuretic medications. She is on prescribed K-Dur 20 mEq every other day.   Please note, in efforts to promote a safe and effective anesthetic course, per current guidelines/standards set by the [facility] anesthesia team, the minimal acceptable K+ level for the patient to proceed with general anesthesia is 3.0 mmol/L. With that being said, if the patient drops any lower, her elective procedure will need to be postponed until K+ is better optimized. In efforts to prevent case cancellation, and ultimately to promote the safety of this patient undergoing sedation/anesthesia, will make efforts to optimize pre-surgical K+ level allowing the surgical intervention to proceed as planned.    Impression and Plan:  Kara Russo found to be HYPOkalemic at 2.9 mmol/L on preoperative labs.   Mg level was added and found to be normal at: 2.3 mg/dL  She is not on diuretic therapy.  Patient takes a K+ supplement every other day. Patient experiencing episodes of NVD. She was seen in the ED for the same and advised that it was hyperemesis related to [facility] use. Also, assume that symptoms could be attributed, at least  in part, to her gallbladder. CBC rechecked today. WBC normal. Mg is normal. Discussed GI related symptoms  and decreased intake of dietary K+ as being contributory to her low K+ levels. Reviewed plans for short term preoperative optimization as follows:   Meds ordered this encounter  Medications   potassium chloride SA (KLOR-CON M) 20 MEQ tablet    Sig: Take 2 tablets (40 mEq) now, then 1 tablet (20 mEq) BID on Saturday and Sunday, then 1 tablet (20 mEq) before coming in for surgery on Monday. Follow up with PCP for repeat labs.    Dispense:  7 tablet    Refill:  0    Please contact the patient as soon as it is available for pickup. Rx is for preoperative K+ optimization and needs to be started ASAP.   Encouraged patient to follow up with PCP about 2-3 weeks postoperatively to have labs rechecked to ensure that levels are remaining within normal range. Discussed nutritional intake of K+ rich foods as an adjunctive way to keep her K+ levels normal; list of K+ rich foods provided. Also mentioned ORS, however advised her not to rely solely on these drinks, as they are high in Na+, and she has a HTN diagnosis.   Will send copy of this note to surgeon and PCP to make them aware of K+ level and plans for correction. Discussed that PCP may elect to increase daily K+ supplement if levels remain low on recheck. Order entered to recheck K+ on the day of her surgery to ensure optimization. Wished patient the best of luck with her upcoming surgery and subsequent recovery. She was  encouraged to return call to the PAT clinic, or to her surgeon's office, should any questions or concerns arise between now and the time of her surgery. Patient was appreciative of the care/concern expressed by PAT staff.   Encounter Diagnoses  Name Primary?   Pre-operative laboratory examination Yes   Hypokalemia    Kara Pereyra, MSN, APRN, FNP-C, CEN [facility]  Perioperative Services Nurse Practitioner Phone:  [phone] 09/23/24 3:44 PM  NOTE: This note has been prepared using Dragon dictation software. Despite my best ability to proofread, there is always the potential that unintentional transcriptional errors may still occur from this process.

## 2024-09-23 NOTE — Patient Instructions (Addendum)
 Your procedure is scheduled on:  MONDAY OCTOBER 6  Report to the Registration Desk on the 1st floor of the [facility] To find out your arrival time, please call [phone] between 1PM - 3PM on:  FRIDAY OCTOBER 3 If your arrival time is 6:00 am, do not arrive before that time as the [facility] entrance doors do not open until 6:00 am.  REMEMBER: Instructions that are not followed completely may result in serious medical risk, up to and including death; or upon the discretion of your surgeon and anesthesiologist your surgery may need to be rescheduled.  Do not eat food after midnight the night before surgery.  No gum chewing or hard candies.  One week prior to surgery: Stop Anti-inflammatories (NSAIDS) such as Advil , Aleve, Ibuprofen , Motrin , Naproxen, Naprosyn and Aspirin based products such as Excedrin, Goody's Powder, BC Powder. Stop ANY OVER THE COUNTER supplements until after surgery.  You may however, continue to take Tylenol  if needed for pain up until the day of surgery.  Continue taking all of your other prescription medications up until the day of surgery.  ON THE DAY OF SURGERY ONLY TAKE THESE MEDICATIONS WITH SIPS OF WATER:  levothyroxine (SYNTHROID)  busPIRone  (BUSPAR )  Omeprazole  ondansetron  (ZOFRAN -ODT)    No Alcohol for 24 hours before or after surgery.  No Smoking including e-cigarettes for 24 hours before surgery.   Do not use any recreational drugs for at least a week (preferably 2 weeks) before your surgery.  Please be advised that the combination of cocaine and anesthesia may have negative outcomes, up to and including death. If you test positive for cocaine, your surgery will be cancelled.  On the morning of surgery brush your teeth with toothpaste and water, you may rinse your mouth with mouthwash if you wish. Do not swallow any toothpaste or mouthwash.  Use CHG Soap as directed on instruction sheet.  Do not wear jewelry, make-up, hairpins,  clips or nail polish.  For welded (permanent) jewelry: bracelets, anklets, waist bands, etc.  Please have this removed prior to surgery.  If it is not removed, there is a chance that hospital personnel will need to cut it off on the day of surgery.  Do not wear lotions, powders, or perfumes.   Do not shave body hair from the neck down 48 hours before surgery.  Contact lenses, hearing aids and dentures may not be worn into surgery.  Do not bring valuables to the hospital. [facility] is not responsible for any missing/lost belongings or valuables.   Notify your doctor if there is any change in your medical condition (cold, fever, infection).  Wear comfortable clothing (specific to your surgery type) to the hospital.  After surgery, you can help prevent lung complications by doing breathing exercises.  Take deep breaths and cough every 1-2 hours.   When coughing or sneezing, hold a pillow firmly against your incision with both hands. This is called "splinting." Doing this helps protect your incision. It also decreases belly discomfort.  If you are being discharged the day of surgery, you will not be allowed to drive home. You will need a responsible individual to drive you home and stay with you for 24 hours after surgery.   If you are taking public transportation, you will need to have a responsible individual with you.  Please call the Pre-admissions Testing Dept. at [phone] if you have any questions about these instructions.  Surgery Visitation Policy:  Patients having surgery or a  procedure may have two visitors.  Children under the age of 37 must have an adult with them who is not the patient.   Merchandiser, retail to address health-related social needs:  [email or web address]                                                                                                             Preparing for Surgery with CHLORHEXIDINE GLUCONATE (CHG)  Soap  Chlorhexidine Gluconate (CHG) Soap  o An antiseptic cleaner that kills germs and bonds with the skin to continue killing germs even after washing  o Used for showering the night before surgery and morning of surgery  Before surgery, you can play an important role by reducing the number of germs on your skin.  CHG (Chlorhexidine gluconate) soap is an antiseptic cleanser which kills germs and bonds with the skin to continue killing germs even after washing.  Please do not use if you have an allergy to CHG or antibacterial soaps. If your skin becomes reddened/irritated stop using the CHG.  1. Shower the NIGHT BEFORE SURGERY with CHG soap.  2. If you choose to wash your hair, wash your hair first as usual with your normal shampoo.  3. After shampooing, rinse your hair and body thoroughly to remove the shampoo.  4. Use CHG as you would any other liquid soap. You can apply CHG directly to the skin and wash gently with a clean washcloth.  5. Apply the CHG soap to your body only from the neck down. Do not use on open wounds or open sores. Avoid contact with your eyes, ears, mouth, and genitals (private parts). Wash face and genitals (private parts) with your normal soap.  6. Wash thoroughly, paying special attention to the area where your surgery will be performed.  7. Thoroughly rinse your body with warm water.  8. Do not shower/wash with your normal soap after using and rinsing off the CHG soap.  9. Do not use lotions, oils, etc., after showering with CHG.  10. Pat yourself dry with a clean towel.  11. Wear clean pajamas to bed the night before surgery.  12. Place clean sheets on your bed the night of your shower and do not sleep with pets.  13. Do not apply any deodorants/lotions/powders.  14. Please wear clean clothes to the hospital.  15. Remember to brush your teeth with your regular toothpaste.

## 2024-09-26 ENCOUNTER — Ambulatory Visit: Payer: Self-pay | Admitting: Urgent Care

## 2024-09-26 ENCOUNTER — Encounter: Payer: Self-pay | Admitting: General Surgery

## 2024-09-26 ENCOUNTER — Other Ambulatory Visit: Payer: Self-pay

## 2024-09-26 ENCOUNTER — Encounter
Admission: RE | Disposition: A | Discharge status: Home / Self Care | Payer: Self-pay | Source: Home / Self Care | Attending: General Surgery

## 2024-09-26 ENCOUNTER — Ambulatory Visit
Admission: RE | Admit: 2024-09-26 | Discharge: 2024-09-26 | Disposition: A | Discharge status: Home / Self Care | Attending: General Surgery | Admitting: General Surgery

## 2024-09-26 DIAGNOSIS — F17210 Nicotine dependence, cigarettes, uncomplicated: Secondary | ICD-10-CM | POA: Insufficient documentation

## 2024-09-26 DIAGNOSIS — F32A Depression, unspecified: Secondary | ICD-10-CM | POA: Diagnosis not present

## 2024-09-26 DIAGNOSIS — K8010 Calculus of gallbladder with chronic cholecystitis without obstruction: Secondary | ICD-10-CM | POA: Diagnosis not present

## 2024-09-26 DIAGNOSIS — E039 Hypothyroidism, unspecified: Secondary | ICD-10-CM | POA: Insufficient documentation

## 2024-09-26 DIAGNOSIS — Z01812 Encounter for preprocedural laboratory examination: Secondary | ICD-10-CM

## 2024-09-26 DIAGNOSIS — K8020 Calculus of gallbladder without cholecystitis without obstruction: Secondary | ICD-10-CM | POA: Diagnosis not present

## 2024-09-26 DIAGNOSIS — E876 Hypokalemia: Secondary | ICD-10-CM

## 2024-09-26 DIAGNOSIS — D72829 Elevated white blood cell count, unspecified: Secondary | ICD-10-CM

## 2024-09-26 LAB — POCT I-STAT, CHEM 8
BUN: 3 mg/dL — ABNORMAL LOW (ref 6–20)
Calcium, Ion: 1.14 mmol/L — ABNORMAL LOW (ref 1.15–1.40)
Chloride: 97 mmol/L — ABNORMAL LOW (ref 98–111)
Creatinine, Ser: 0.8 mg/dL (ref 0.44–1.00)
Glucose, Bld: 131 mg/dL — ABNORMAL HIGH (ref 70–99)
HCT: 41 % (ref 36.0–46.0)
Hemoglobin: 13.9 g/dL (ref 12.0–15.0)
Potassium: 3.3 mmol/L — ABNORMAL LOW (ref 3.5–5.1)
Sodium: 140 mmol/L (ref 135–145)
TCO2: 28 mmol/L (ref 22–32)

## 2024-09-26 SURGERY — CHOLECYSTECTOMY, ROBOT-ASSISTED, LAPAROSCOPIC
Anesthesia: General | Site: Abdomen

## 2024-09-26 MED ORDER — LACTATED RINGERS IV SOLN: INTRAVENOUS | Status: DC

## 2024-09-26 MED ORDER — CEFAZOLIN SODIUM-DEXTROSE 2-4 GM/100ML-% IV SOLN
2.0000 g | INTRAVENOUS | Status: AC
  Administered 2024-09-26: 2 g via INTRAVENOUS

## 2024-09-26 MED ORDER — LIDOCAINE HCL (CARDIAC) PF 100 MG/5ML IV SOSY
PREFILLED_SYRINGE | INTRAVENOUS | Status: DC | PRN
  Administered 2024-09-26: 60 mg via INTRAVENOUS

## 2024-09-26 MED ORDER — ORAL CARE MOUTH RINSE: 15.0000 mL | Freq: Once | OROMUCOSAL | Status: AC

## 2024-09-26 MED ORDER — CHLORHEXIDINE GLUCONATE 0.12 % MT SOLN
15.0000 mL | Freq: Once | OROMUCOSAL | Status: AC
  Administered 2024-09-26: 15 mL via OROMUCOSAL

## 2024-09-26 MED ORDER — BUPIVACAINE-EPINEPHRINE (PF) 0.25% -1:200000 IJ SOLN
INTRAMUSCULAR | Status: AC
  Filled 2024-09-26: qty 30

## 2024-09-26 MED ORDER — PROPOFOL 500 MG/50ML IV EMUL
INTRAVENOUS | Status: DC | PRN
  Administered 2024-09-26: 120 ug/kg/min via INTRAVENOUS

## 2024-09-26 MED ORDER — LABETALOL HCL 5 MG/ML IV SOLN
INTRAVENOUS | Status: AC
  Filled 2024-09-26: qty 4

## 2024-09-26 MED ORDER — ROCURONIUM BROMIDE 100 MG/10ML IV SOLN
INTRAVENOUS | Status: DC | PRN
  Administered 2024-09-26: 10 mg via INTRAVENOUS
  Administered 2024-09-26: 50 mg via INTRAVENOUS

## 2024-09-26 MED ORDER — FENTANYL CITRATE (PF) 100 MCG/2ML IJ SOLN
INTRAMUSCULAR | Status: AC
  Filled 2024-09-26: qty 2

## 2024-09-26 MED ORDER — INDOCYANINE GREEN 25 MG IV SOLR
INTRAVENOUS | Status: AC
  Filled 2024-09-26: qty 10

## 2024-09-26 MED ORDER — OXYCODONE HCL 5 MG/5ML PO SOLN: 5.0000 mg | Freq: Once | ORAL | Status: AC | PRN

## 2024-09-26 MED ORDER — MIDAZOLAM HCL 2 MG/2ML IJ SOLN
INTRAMUSCULAR | Status: DC | PRN
  Administered 2024-09-26: 2 mg via INTRAVENOUS

## 2024-09-26 MED ORDER — SUGAMMADEX SODIUM 200 MG/2ML IV SOLN
INTRAVENOUS | Status: DC | PRN
  Administered 2024-09-26 (×2): 25 mg via INTRAVENOUS
  Administered 2024-09-26: 50 mg via INTRAVENOUS

## 2024-09-26 MED ORDER — OXYCODONE HCL 5 MG PO TABS
5.0000 mg | ORAL_TABLET | Freq: Once | ORAL | Status: AC | PRN
  Administered 2024-09-26: 5 mg via ORAL

## 2024-09-26 MED ORDER — PROPOFOL 10 MG/ML IV BOLUS
INTRAVENOUS | Status: DC | PRN
  Administered 2024-09-26: 130 mg via INTRAVENOUS

## 2024-09-26 MED ORDER — FENTANYL CITRATE (PF) 100 MCG/2ML IJ SOLN
25.0000 ug | INTRAMUSCULAR | Status: AC | PRN
  Administered 2024-09-26 (×6): 25 ug via INTRAVENOUS

## 2024-09-26 MED ORDER — ALBUTEROL SULFATE HFA 108 (90 BASE) MCG/ACT IN AERS
INHALATION_SPRAY | RESPIRATORY_TRACT | Status: DC | PRN
  Administered 2024-09-26: 4 via RESPIRATORY_TRACT

## 2024-09-26 MED ORDER — 0.9 % SODIUM CHLORIDE (POUR BTL) OPTIME
TOPICAL | Status: DC | PRN
  Administered 2024-09-26: 500 mL

## 2024-09-26 MED ORDER — HYDRALAZINE HCL 20 MG/ML IJ SOLN
INTRAMUSCULAR | Status: AC
  Filled 2024-09-26: qty 1

## 2024-09-26 MED ORDER — ONDANSETRON HCL 4 MG/2ML IJ SOLN
INTRAMUSCULAR | Status: DC | PRN
  Administered 2024-09-26: 4 mg via INTRAVENOUS

## 2024-09-26 MED ORDER — INDOCYANINE GREEN 25 MG IV SOLR
1.2500 mg | Freq: Once | INTRAVENOUS | Status: AC
Start: 2024-09-26 — End: 2024-09-26
  Administered 2024-09-26: 1.25 mg via INTRAVENOUS

## 2024-09-26 MED ORDER — HYDRALAZINE HCL 20 MG/ML IJ SOLN
10.0000 mg | Freq: Once | INTRAMUSCULAR | Status: AC
  Administered 2024-09-26: 10 mg via INTRAVENOUS

## 2024-09-26 MED ORDER — CEFAZOLIN SODIUM-DEXTROSE 2-4 GM/100ML-% IV SOLN
INTRAVENOUS | Status: AC
  Filled 2024-09-26: qty 100

## 2024-09-26 MED ORDER — LABETALOL HCL 5 MG/ML IV SOLN
INTRAVENOUS | Status: DC | PRN
  Administered 2024-09-26: 2.5 mg via INTRAVENOUS

## 2024-09-26 MED ORDER — HYDROCODONE-ACETAMINOPHEN 5-325 MG PO TABS
1.0000 | ORAL_TABLET | Freq: Four times a day (QID) | ORAL | 0 refills | Status: AC | PRN
  Filled 2024-09-26: qty 12, 3d supply, fill #0

## 2024-09-26 MED ORDER — CHLORHEXIDINE GLUCONATE 0.12 % MT SOLN
OROMUCOSAL | Status: AC
Start: 2024-09-26 — End: 2024-09-26
  Filled 2024-09-26: qty 15

## 2024-09-26 MED ORDER — BUPIVACAINE-EPINEPHRINE 0.25% -1:200000 IJ SOLN
INTRAMUSCULAR | Status: DC | PRN
  Administered 2024-09-26: 30 mL

## 2024-09-26 MED ORDER — FENTANYL CITRATE (PF) 100 MCG/2ML IJ SOLN
INTRAMUSCULAR | Status: DC | PRN
  Administered 2024-09-26 (×2): 50 ug via INTRAVENOUS

## 2024-09-26 MED ORDER — OXYCODONE HCL 5 MG PO TABS
ORAL_TABLET | ORAL | Status: AC
  Filled 2024-09-26: qty 1

## 2024-09-26 MED ORDER — DEXAMETHASONE SODIUM PHOSPHATE 10 MG/ML IJ SOLN
INTRAMUSCULAR | Status: DC | PRN
  Administered 2024-09-26: 10 mg via INTRAVENOUS

## 2024-09-26 MED ORDER — MIDAZOLAM HCL 2 MG/2ML IJ SOLN
INTRAMUSCULAR | Status: AC
  Filled 2024-09-26: qty 2

## 2024-09-26 SURGICAL SUPPLY — 42 items
BAG PRESSURE INF REUSE 1000 (BAG) IMPLANT
CANNULA REDUCER 12-8 DVNC XI (CANNULA) ×1 IMPLANT
CAUTERY HOOK MNPLR 1.6 DVNC XI (INSTRUMENTS) ×1 IMPLANT
CLIP LIGATING HEM O LOK PURPLE (MISCELLANEOUS) IMPLANT
CLIP LIGATING HEMO O LOK GREEN (MISCELLANEOUS) ×1 IMPLANT
DEFOGGER SCOPE WARM SEASHARP (MISCELLANEOUS) ×1 IMPLANT
DERMABOND ADVANCED .7 DNX12 (GAUZE/BANDAGES/DRESSINGS) ×1 IMPLANT
DRAPE ARM DVNC X/XI (DISPOSABLE) ×4 IMPLANT
DRAPE C-ARM XRAY 36X54 (DRAPES) IMPLANT
DRAPE COLUMN DVNC XI (DISPOSABLE) ×1 IMPLANT
ELECTRODE REM PT RTRN 9FT ADLT (ELECTROSURGICAL) ×1 IMPLANT
FORCEPS BPLR 8 MD DVNC XI (FORCEP) IMPLANT
FORCEPS BPLR FENES DVNC XI (FORCEP) ×1 IMPLANT
FORCEPS PROGRASP DVNC XI (FORCEP) ×1 IMPLANT
GLOVE BIO SURGEON STRL SZ 6.5 (GLOVE) ×2 IMPLANT
GLOVE BIOGEL PI IND STRL 6.5 (GLOVE) ×2 IMPLANT
GLOVE SURG SYN 6.5 PF PI (GLOVE) ×2 IMPLANT
GOWN STRL REUS W/ TWL LRG LVL3 (GOWN DISPOSABLE) ×4 IMPLANT
GRASPER SUT TROCAR 14GX15 (MISCELLANEOUS) ×1 IMPLANT
IRRIGATOR SUCT 8 DISP DVNC XI (IRRIGATION / IRRIGATOR) IMPLANT
IV 0.9% NACL 1000 ML (IV SOLUTION) IMPLANT
IV CATH ANGIO 12GX3 LT BLUE (NEEDLE) IMPLANT
KIT PINK PAD W/HEAD ARM REST (MISCELLANEOUS) ×1 IMPLANT
LABEL OR SOLS (LABEL) ×1 IMPLANT
MANIFOLD NEPTUNE II (INSTRUMENTS) IMPLANT
NDL HYPO 22X1.5 SAFETY MO (MISCELLANEOUS) ×1 IMPLANT
NDL INSUFFLATION 14GA 120MM (NEEDLE) ×1 IMPLANT
NEEDLE HYPO 22X1.5 SAFETY MO (MISCELLANEOUS) ×1 IMPLANT
NEEDLE INSUFFLATION 14GA 120MM (NEEDLE) ×1 IMPLANT
NS IRRIG 500ML POUR BTL (IV SOLUTION) ×1 IMPLANT
OBTURATOR OPTICALSTD 8 DVNC (TROCAR) ×1 IMPLANT
PACK LAP CHOLECYSTECTOMY (MISCELLANEOUS) ×1 IMPLANT
SEAL UNIV 5-12 XI (MISCELLANEOUS) ×4 IMPLANT
SET TUBE SMOKE EVAC HIGH FLOW (TUBING) ×1 IMPLANT
SOLUTION ELECTROSURG ANTI STCK (MISCELLANEOUS) ×1 IMPLANT
SPIKE FLUID TRANSFER (MISCELLANEOUS) ×2 IMPLANT
SPONGE T-LAP 4X18 ~~LOC~~+RFID (SPONGE) IMPLANT
SUT VICRYL 0 UR6 27IN ABS (SUTURE) ×1 IMPLANT
SUTURE MNCRL 4-0 27XMF (SUTURE) ×1 IMPLANT
SYSTEM BAG RETRIEVAL 10MM (BASKET) ×1 IMPLANT
TRAP FLUID SMOKE EVACUATOR (MISCELLANEOUS) IMPLANT
WATER STERILE IRR 500ML POUR (IV SOLUTION) ×1 IMPLANT

## 2024-09-26 NOTE — Op Note (Signed)
 Preoperative diagnosis: Cholelithiasis  Postoperative diagnosis: Same  Procedure: Robotic Assisted Laparoscopic Cholecystectomy.   Anesthesia: GETA   Surgeon: Dr. Cesar Coe  Wound Classification: Clean Contaminated  Indications: Patient is a 58 y.o. female developed right upper quadrant pain and on workup was found to have cholelithiasis. Robotic Assisted Laparoscopic cholecystectomy was elected.  Findings:  Critical view of safety achieved Cystic duct and artery identified, ligated and divided Adequate hemostasis    Description of procedure: The patient was placed on the operating table in the supine position. General anesthesia was induced. A time-out was completed verifying correct patient, procedure, site, positioning, and implant(s) and/or special equipment prior to beginning this procedure. An orogastric tube was placed. The abdomen was prepped and draped in the usual sterile fashion.  An incision was made in a natural skin line below the umbilicus.  The fascia was elevated and the Veress needle inserted. Proper position was confirmed by aspiration and saline meniscus test.  The abdomen was insufflated with carbon dioxide to a pressure of 15 mmHg. The patient tolerated insufflation well. A 8-mm trocar was then inserted in optiview fashion.  The laparoscope was inserted and the abdomen inspected. No injuries from initial trocar placement were noted. Additional trocars were then inserted in the following locations: an 8-mm trocar in the left lateral abdomen, and another two 8-mm trocars to the right side of the abdomen 5 cm appart. The umbilical trocar was changed to a 12 mm trocar all under direct visualization. The abdomen was inspected and no abnormalities were found. The table was placed in the reverse Trendelenburg position with the right side up. The robotic arms were docked and target anatomy identified. Instrument inserted under direct visualization.  Filmy adhesions between  the gallbladder and omentum, duodenum and transverse colon were lysed with electrocautery. The dome of the gallbladder was grasped with a prograsp and retracted over the dome of the liver. The infundibulum was also grasped with an atraumatic grasper and retracted toward the right lower quadrant. This maneuver exposed Calot's triangle. The peritoneum overlying the gallbladder infundibulum was then incised and the cystic duct and cystic artery identified and circumferentially dissected. Critical view of safety reviewed before ligating any structure. Firefly images taken to visualize biliary ducts. The cystic duct and cystic artery were then doubly clipped and divided close to the gallbladder.  The gallbladder was then dissected from its peritoneal attachments by electrocautery. Hemostasis was checked and the gallbladder and contained stones were removed using an endoscopic retrieval bag. The gallbladder was passed off the table as a specimen. There was no evidence of bleeding from the gallbladder fossa or cystic artery or leakage of the bile from the cystic duct stump. Secondary trocars were removed under direct vision. No bleeding was noted. The robotic arms were undoked. The scope was withdrawn and the umbilical trocar removed. The abdomen was allowed to collapse. The fascia of the 12mm trocar sites was closed with figure-of-eight 0 vicryl sutures. The skin was closed with subcuticular sutures of 4-0 monocryl and topical skin adhesive. The orogastric tube was removed.  The patient tolerated the procedure well and was taken to the postanesthesia care unit in stable condition.   Specimen: Gallbladder  Complications: None  EBL: 5 mL

## 2024-09-26 NOTE — Anesthesia Procedure Notes (Signed)
 Procedure Name: Intubation Date/Time: 09/26/2024 12:04 PM  Performed by: Brien Sotero PARAS, CRNAPre-anesthesia Checklist: Patient identified, Patient being monitored, Timeout performed, Emergency Drugs available and Suction available Patient Re-evaluated:Patient Re-evaluated prior to induction Oxygen Delivery Method: Circle system utilized Preoxygenation: Pre-oxygenation with 100% oxygen Induction Type: IV induction Ventilation: Mask ventilation without difficulty Laryngoscope Size: Miller and 2 Grade View: Grade I Tube type: Oral Tube size: 7.0 mm Number of attempts: 1 Airway Equipment and Method: Stylet and Oral airway Placement Confirmation: ETT inserted through vocal cords under direct vision, positive ETCO2 and breath sounds checked- equal and bilateral Secured at: 21 cm Tube secured with: Tape Dental Injury: Teeth and Oropharynx as per pre-operative assessment

## 2024-09-26 NOTE — Anesthesia Preprocedure Evaluation (Signed)
 Anesthesia Evaluation  Patient identified by MRN, date of birth, ID band Patient awake    Reviewed: Allergy & Precautions, NPO status , Patient's Chart, lab work & pertinent test results  History of Anesthesia Complications Negative for: history of anesthetic complications  Airway Mallampati: III  TM Distance: >3 FB Neck ROM: full    Dental  (+) Chipped, Poor Dentition, Missing, Upper Dentures, Partial Lower   Pulmonary neg shortness of breath, Current Smoker   Pulmonary exam normal        Cardiovascular Exercise Tolerance: Good negative cardio ROS Normal cardiovascular exam     Neuro/Psych negative neurological ROS  negative psych ROS   GI/Hepatic negative GI ROS, Neg liver ROS,neg GERD  ,,  Endo/Other  Hypothyroidism    Renal/GU      Musculoskeletal   Abdominal   Peds  Hematology negative hematology ROS (+)   Anesthesia Other Findings Past Medical History: No date: Arthritis 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     Reproductive/Obstetrics negative OB ROS                              Anesthesia Physical Anesthesia Plan  ASA: 2  Anesthesia Plan: General ETT   Post-op Pain Management:    Induction: Intravenous  PONV Risk Score and Plan: Ondansetron , Dexamethasone , Midazolam and Treatment may vary due to age or medical condition  Airway Management Planned: Oral ETT  Additional Equipment:   Intra-op Plan:   Post-operative Plan: Extubation in OR  Informed Consent: I have reviewed the patients History and Physical, chart, labs and discussed the procedure including the risks, benefits and alternatives for the proposed anesthesia with the patient or authorized representative who has indicated his/her understanding and acceptance.      Dental Advisory Given  Plan Discussed with: Anesthesiologist, CRNA and Surgeon  Anesthesia Plan Comments: (Patient consented for risks of anesthesia including but not limited to:  - adverse reactions to medications - damage to eyes, teeth, lips or other oral mucosa - nerve damage due to positioning  - sore throat or hoarseness - Damage to heart, brain, nerves, lungs, other parts of body or loss of life  Patient voiced understanding and assent.)        Anesthesia Quick Evaluation

## 2024-09-26 NOTE — Discharge Instructions (Addendum)

## 2024-09-26 NOTE — Transfer of Care (Signed)
 Immediate Anesthesia Transfer of Care Note  Patient: Kara Russo  Procedure(s) Performed: CHOLECYSTECTOMY, ROBOT-ASSISTED, LAPAROSCOPIC (Abdomen)  Patient Location: PACU  Anesthesia Type:General  Level of Consciousness: awake and drowsy  Airway & Oxygen Therapy: Patient Spontanous Breathing and Patient connected to face mask oxygen  Post-op Assessment: Report given to RN and Post -op Vital signs reviewed and stable  Post vital signs: Reviewed  Last Vitals:  Vitals Value Taken Time  BP 166/93 09/26/24 13:00  Temp    Pulse 64 09/26/24 13:02  Resp 17 09/26/24 13:02  SpO2 100 % 09/26/24 13:02  Vitals shown include unfiled device data.  Last Pain:  Vitals:   09/26/24 1049  TempSrc: Temporal  PainSc: 0-No pain         Complications: There were no known notable events for this encounter.

## 2024-09-26 NOTE — H&P (Signed)
 History of Present Illness Kara Russo is a 58 year old female with choledocholithiasis who presents with right upper quadrant pain.  She has been experiencing right upper quadrant abdominal pain for the past few months. The pain is described as contraction-like, becoming very hard and radiating to her back. It is associated with nausea, particularly after eating, although eating initially provides some relief.  She has a history of gastrointestinal symptoms over the past four years, including excessive gas, runny stools, and acid reflux leading to vomiting. An endoscopy performed four years ago was normal. Despite these symptoms, she has not had any abdominal surgeries.  An ultrasound performed on July 13, 2034, revealed gallstones. A CT scan, conducted as part of her annual lung screening due to smoking, indicated a liver problem, which led to the discovery of the gallstones.    PAST MEDICAL HISTORY:  Past Medical History:  Diagnosis Date  ADHD (attention deficit hyperactivity disorder)  Anxiety  Depression  Fibrocystic breast disease  Hyperlipidemia  Hypothyroidism  Juvenile rheumatoid arthritis (CMS/HHS-HCC)  age 60     PAST SURGICAL HISTORY:  Past Surgical History:  Procedure Laterality Date  TOOTH EXTRACTION 1982  wisdom teeth x 4  COLONOSCOPY 01/01/2022  Tubular adenoma/Hyperplastic polyp/Repeat 60yrs/SMR  EGD 01/01/2022  Gastritis/Duodenitis/No repeat/SMR    MEDICATIONS:  Outpatient Encounter Medications as of 08/16/2024  Medication Sig Dispense Refill  albuterol MDI, PROVENTIL, VENTOLIN, PROAIR, HFA 90 mcg/actuation inhaler Inhale 2 inhalations into the lungs every 6 (six) hours as needed for Wheezing 1 each 2  brexpiprazole  (REXULTI ) 0.25 mg Tab Take 0.25 mg by mouth 2 (two) times daily  cyclobenzaprine (FLEXERIL) 10 MG tablet TAKE 1/2 TO 1 TABLET BY MOUTH AT BEDTIME AS NEEDED 30 tablet 1  diazePAM (VALIUM) 2 MG tablet 1-2 po 30 minutes before ESI 2 tablet 0   esomeprazole (NEXIUM) 40 MG DR capsule Take 1 capsule (40 mg total) by mouth once daily 30 capsule 11  estradioL (ESTRACE) 1 MG tablet Take 1 tablet (1 mg total) by mouth once daily 30 tablet 11  levothyroxine (SYNTHROID) 112 MCG tablet Take 1 tablet (112 mcg total) by mouth once daily Take on an empty stomach with a glass of water at least 30-60 minutes before breakfast. 90 tablet 3  meloxicam (MOBIC) 15 MG tablet Take 1 tablet (15 mg total) by mouth once daily 30 tablet 0  potassium chloride (K-TAB) 20 mEq TbER ER tablet Take 1 tablet (20 mEq total) by mouth 2 (two) times daily 60 tablet 11  rosuvastatin (CRESTOR) 10 MG tablet Take 1 tablet (10 mg total) by mouth once daily 90 tablet 3  venlafaxine  (EFFEXOR ) 37.5 MG tablet TAKE 1 TABLET BY MOUTH 2 TIMES DAILY. 180 tablet 2  predniSONE (DELTASONE) 10 MG tablet 40mg  x 2 days, 30mg  x 2days, 20mg  x 2days, 10mg  x 2days (Patient not taking: Reported on 08/16/2024) 20 tablet 0   No facility-administered encounter medications on file as of 08/16/2024.    ALLERGIES:  Wellbutrin  [bupropion  hcl]  SOCIAL HISTORY:  Social History   Socioeconomic History  Marital status: Divorced  Number of children: 2  Years of education: 14  Highest education level: Associate degree: occupational, Scientist, product/process development, or vocational program  Occupational History  Occupation: [employer]  Tobacco Use  Smoking status: Every Day  Current packs/day: 1.00  Average packs/day: 1 pack/day for 44.6 years (44.6 ttl pk-yrs)  Types: Cigarettes  Start date: 11  Smokeless tobacco: Never  Vaping Use  Vaping status: Never Used  Substance and Sexual Activity  Alcohol use: Never  Drug use: No  Sexual activity: Not Currently  Birth control/protection: Post-menopausal   Social Drivers of Health   Financial Resource Strain: Medium Risk (08/16/2024)  Overall Financial Resource Strain (CARDIA)  Difficulty of Paying Living Expenses: Somewhat hard  Food Insecurity: No  Food Insecurity (08/16/2024)  Hunger Vital Sign  Worried About Running Out of Food in the Last Year: Never true  Ran Out of Food in the Last Year: Never true  Transportation Needs: No Transportation Needs (08/16/2024)  PRAPARE - Risk analyst (Medical): No  Lack of Transportation (Non-Medical): No   FAMILY HISTORY:  Family History  Adopted: Yes  Problem Relation Name Age of Onset  Bipolar disorder Mother  No Known Problems Brother half  No Known Problems Father  No Known Problems Sister  No Known Problems Maternal Grandmother  No Known Problems Maternal Grandfather  No Known Problems Paternal Grandmother  No Known Problems Paternal Grandfather  Developmental delay Brother half  Developmental delay Brother half  Suicidality Brother half  COD  Developmental delay Brother half  Suicidality Brother half  COD  No Known Problems Daughter  No Known Problems Son    GENERAL REVIEW OF SYSTEMS:   General ROS: negative for - chills, fatigue, fever, weight gain or weight loss Allergy and Immunology ROS: negative for - hives  Hematological and Lymphatic ROS: negative for - bleeding problems or bruising, negative for palpable nodes Endocrine ROS: negative for - heat or cold intolerance, hair changes Respiratory ROS: negative for - cough, shortness of breath or wheezing Cardiovascular ROS: no chest pain or palpitations GI ROS: negative for nausea, vomiting, positive for abdominal pain, diarrhea Musculoskeletal ROS: negative for - joint swelling or muscle pain Neurological ROS: negative for - confusion, syncope Dermatological ROS: negative for pruritus and rash  PHYSICAL EXAM:  Vitals:  08/16/24 1107  BP: 113/72  Pulse: 76  .  Ht:167.6 cm (5' 6) Wt:69.9 kg (154 lb) ADJ:Anib surface area is 1.8 meters squared. Body mass index is 24.86 kg/m.SABRA  GENERAL: Alert, active, oriented x3  HEENT: Pupils equal reactive to light. Extraocular movements are intact.  Sclera clear. Palpebral conjunctiva normal red color.Pharynx clear.  NECK: Supple with no palpable mass and no adenopathy.  LUNGS: Sound clear with no rales rhonchi or wheezes.  HEART: Regular rhythm S1 and S2 without murmur.  ABDOMEN: Soft and depressible, nontender with no palpable mass, no hepatomegaly.   EXTREMITIES: Well-developed well-nourished symmetrical with no dependent edema.  NEUROLOGICAL: Awake alert oriented, facial expression symmetrical, moving all extremities.  Results RADIOLOGY Abdominal ultrasound: Cholelithiasis (07/13/2024) Chest CT: Hepatic abnormality  Assessment & Plan Symptomatic cholelithiasis  She experiences chronic right upper quadrant abdominal pain radiating to the back, with postprandial nausea, consistent with symptomatic cholelithiasis. Ultrasound confirmed gallstones. Symptoms like gassiness, diarrhea, and acid reflux are likely unrelated to gallbladder issues. Recommend laparoscopic cholecystectomy to alleviate pain and nausea. This minimally invasive procedure allows same-day discharge and a two-week recovery. Patient will like to wait until January 2026 to have the surgery done. Instruct her to call the office in December or January to schedule surgery if insurance changes and symptoms remain manageable. If she is starting develop pain more frequent or more intense pain she should call our office before to schedule cholecystectomy.  Cholelithiasis without cholecystitis [K80.20]

## 2024-09-28 LAB — SURGICAL PATHOLOGY

## 2024-10-03 NOTE — Anesthesia Postprocedure Evaluation (Signed)
 Anesthesia Post Note  Patient: Kara Russo  Procedure(s) Performed: CHOLECYSTECTOMY, ROBOT-ASSISTED, LAPAROSCOPIC (Abdomen)  Patient location during evaluation: PACU Anesthesia Type: General Level of consciousness: awake and alert Pain management: pain level controlled Vital Signs Assessment: post-procedure vital signs reviewed and stable Respiratory status: spontaneous breathing, nonlabored ventilation, respiratory function stable and patient connected to nasal cannula oxygen Cardiovascular status: blood pressure returned to baseline and stable Postop Assessment: no apparent nausea or vomiting Anesthetic complications: no   There were no known notable events for this encounter.   Last Vitals:  Vitals:   09/26/24 1400 09/26/24 1414  BP: 123/85 (!) 158/83  Pulse: 73 67  Resp: 17 18  Temp:  36.8 C  SpO2: 93% 94%    Last Pain:  Vitals:   09/26/24 1414  TempSrc: Temporal  PainSc: 5                  [place] KANDICE Clause

## 2024-11-16 ENCOUNTER — Other Ambulatory Visit (HOSPITAL_COMMUNITY): Payer: Self-pay

## 2024-12-20 ENCOUNTER — Other Ambulatory Visit: Payer: Self-pay | Admitting: Psychiatry

## 2025-03-21 ENCOUNTER — Ambulatory Visit: Admitting: Internal Medicine
